# Patient Record
Sex: Female | Born: 1960 | Race: Black or African American | Hispanic: No | Marital: Single
Health system: Southern US, Community
[De-identification: ages and names within clinical notes are randomized; demographics above are authoritative.]

## PROBLEM LIST (undated history)

## (undated) DIAGNOSIS — R569 Unspecified convulsions: Secondary | ICD-10-CM

## (undated) DIAGNOSIS — I10 Essential (primary) hypertension: Secondary | ICD-10-CM

## (undated) NOTE — *Deleted (*Deleted)
February 09, 2020 ICU attending note  Patient seen and examined with PA [name]. Patient seen for cardiac arrest.  This is a 29 year old woman with hx of HTN and seizure who suffered an out of hospital witnessed arrest preceded by seizures.  Unknown downtime before CPR started.  Initial rhythm on monitor wide complex, shocked.  Supposedly 5 minutes downtime although documentation is spotty.  Patient did not regain consciousness so cooling protocol initiated.  There were some ST changes on her initial EKG, cardiology evaluated the patient and deferred catheterization as it was thought this was a stress reaction rather than a ST EMI.  With sedation halting here, the patient has rhythmic twitching, rightward gaze deviation, does have a cough and a gag, she will open her eyes to sternal rub but will not follow any commands.  Lungs are clear, heart sounds are regular, extremities are warm.  Abdomen is soft with hypoactive bowel sounds.  ABG showing respiratory insufficiency.  Unclear if ventilator was changed after this. Blood chemistries notable for hypokalemia, hyperglycemia, mild acute kidney injury, elevation of AST over ALT greater than 2-1.  Troponin level 54-1 80.  White count is 21 with a lymphocytosis.  Urine toxicology showing cocaine. Alcohol level not checked for unclear reason.  Assessment Out of hospital cardiac arrest, presumed shockable initial rhythm.  Documentation is spotty but at least 5 minutes of CPR, unclear downtime.  U tox showing cocaine.  Alcohol level pending.  Proceeding seizure.  Differential is toxic effects of cocaine, seizure itself.  Acute hypoxic and hypercarbic respiratory failure secondary to cardiac arrest.  On the ventilator.  Acute toxic metabolic encephalopathy with question of anoxic brain injury.  Acute kidney injury (cr 1.1, 0.78 on separate chart)  Plan Targeted temperature management, continuous EEG, echocardiogram Follow-up alcohol and Dilantin levels Avoid  nephrotoxic agents, LR at 75/hr Seizure precautions Propofol and fentanyl for sedation Prognostication pending 72 hours post rewarming unless there are more ominous signs like refractory status epilepticus or myoclonus. We will reach out to family   Patient critically ill due to cardiac arrest Interventions to address this today targeted temp management, ventilator management, EEG, sedation Risk of deterioration without these interventions is high  I personally spent 34 minutes providing critical care not including any separately billable procedures  Myrla Halsted MD [facility] 02/09/20 8:16 PM Personal pager: #[phone] If unanswered, please page CCM On-call: #[phone]

---

## 2004-12-29 ENCOUNTER — Emergency Department: Payer: Self-pay | Admitting: Unknown Physician Specialty

## 2005-03-12 ENCOUNTER — Emergency Department: Payer: Self-pay | Admitting: Emergency Medicine

## 2005-03-12 ENCOUNTER — Other Ambulatory Visit: Payer: Self-pay

## 2005-03-13 ENCOUNTER — Inpatient Hospital Stay: Payer: Self-pay | Admitting: Infectious Diseases

## 2006-10-22 ENCOUNTER — Ambulatory Visit: Payer: Self-pay

## 2006-10-24 ENCOUNTER — Ambulatory Visit: Payer: Self-pay

## 2007-11-23 ENCOUNTER — Emergency Department: Payer: Self-pay | Admitting: Emergency Medicine

## 2011-06-14 ENCOUNTER — Emergency Department: Payer: Self-pay | Admitting: *Deleted

## 2011-06-14 LAB — CBC WITH DIFFERENTIAL/PLATELET
Basophil #: 0 10*3/uL (ref 0.0–0.1)
Basophil %: 0.4 %
Eosinophil #: 0 10*3/uL (ref 0.0–0.7)
HCT: 33 % — ABNORMAL LOW (ref 35.0–47.0)
HGB: 10.6 g/dL — ABNORMAL LOW (ref 12.0–16.0)
Lymphocyte %: 37.8 %
MCH: 25 pg — ABNORMAL LOW (ref 26.0–34.0)
MCHC: 32.1 g/dL (ref 32.0–36.0)
Monocyte #: 0.5 10*3/uL (ref 0.0–0.7)
Monocyte %: 8.4 %
Neutrophil #: 3.4 10*3/uL (ref 1.4–6.5)
Neutrophil %: 53.1 %
RDW: 22.8 % — ABNORMAL HIGH (ref 11.5–14.5)

## 2011-06-14 LAB — COMPREHENSIVE METABOLIC PANEL
Alkaline Phosphatase: 59 U/L (ref 50–136)
BUN: 5 mg/dL — ABNORMAL LOW (ref 7–18)
Bilirubin,Total: 0.2 mg/dL (ref 0.2–1.0)
Chloride: 108 mmol/L — ABNORMAL HIGH (ref 98–107)
Co2: 24 mmol/L (ref 21–32)
Creatinine: 0.69 mg/dL (ref 0.60–1.30)
EGFR (African American): >60
EGFR (Non-African Amer.): >60
Osmolality: 280 (ref 275–301)
Sodium: 142 mmol/L (ref 136–145)
Total Protein: 8.8 g/dL — ABNORMAL HIGH (ref 6.4–8.2)

## 2011-06-14 LAB — PHENYTOIN LEVEL, TOTAL: Dilantin: 0.7 ug/mL — ABNORMAL LOW (ref 10.0–20.0)

## 2013-01-23 ENCOUNTER — Emergency Department: Payer: Self-pay | Admitting: Emergency Medicine

## 2014-03-13 LAB — DRUG SCREEN, URINE
Amphetamines, Ur Screen: NEGATIVE (ref ?–1000)
BENZODIAZEPINE, UR SCRN: NEGATIVE (ref ?–200)
Barbiturates, Ur Screen: NEGATIVE (ref ?–200)
CANNABINOID 50 NG, UR ~~LOC~~: NEGATIVE (ref ?–50)
COCAINE METABOLITE, UR ~~LOC~~: POSITIVE (ref ?–300)
MDMA (ECSTASY) UR SCREEN: NEGATIVE (ref ?–500)
METHADONE, UR SCREEN: NEGATIVE (ref ?–300)
Opiate, Ur Screen: NEGATIVE (ref ?–300)
PHENCYCLIDINE (PCP) UR S: NEGATIVE (ref ?–25)
TRICYCLIC, UR SCREEN: NEGATIVE (ref ?–1000)

## 2014-03-13 LAB — COMPREHENSIVE METABOLIC PANEL
ALK PHOS: 91 U/L
ALT: 46 U/L
Albumin: 3.5 g/dL (ref 3.4–5.0)
Anion Gap: 10 (ref 7–16)
BUN: 6 mg/dL — ABNORMAL LOW (ref 7–18)
Bilirubin,Total: 0.5 mg/dL (ref 0.2–1.0)
Calcium, Total: 8.2 mg/dL — ABNORMAL LOW (ref 8.5–10.1)
Chloride: 108 mmol/L — ABNORMAL HIGH (ref 98–107)
Co2: 22 mmol/L (ref 21–32)
Creatinine: 0.7 mg/dL (ref 0.60–1.30)
EGFR (African American): >60
Glucose: 81 mg/dL (ref 65–99)
Osmolality: 276 (ref 275–301)
Potassium: 3.8 mmol/L (ref 3.5–5.1)
SGOT(AST): 70 U/L — ABNORMAL HIGH (ref 15–37)
Sodium: 140 mmol/L (ref 136–145)
Total Protein: 8.7 g/dL — ABNORMAL HIGH (ref 6.4–8.2)

## 2014-03-13 LAB — URINALYSIS, COMPLETE
BILIRUBIN, UR: NEGATIVE
BLOOD: NEGATIVE
GLUCOSE, UR: NEGATIVE mg/dL (ref 0–75)
Ketone: NEGATIVE
Nitrite: NEGATIVE
Ph: 5 (ref 4.5–8.0)
Protein: NEGATIVE
SPECIFIC GRAVITY: 1.002 (ref 1.003–1.030)
Squamous Epithelial: <1
WBC UR: 3 /HPF (ref 0–5)

## 2014-03-13 LAB — CBC
HCT: 40.6 % (ref 35.0–47.0)
HGB: 13.8 g/dL (ref 12.0–16.0)
MCH: 32.8 pg (ref 26.0–34.0)
MCHC: 34 g/dL (ref 32.0–36.0)
MCV: 96 fL (ref 80–100)
Platelet: 355 10*3/uL (ref 150–440)
RBC: 4.22 10*6/uL (ref 3.80–5.20)
RDW: 13.7 % (ref 11.5–14.5)
WBC: 9.8 10*3/uL (ref 3.6–11.0)

## 2014-03-13 LAB — ACETAMINOPHEN LEVEL

## 2014-03-13 LAB — VALPROIC ACID LEVEL

## 2014-03-13 LAB — ETHANOL
Ethanol: 253 mg/dL
Ethanol: 26 mg/dL

## 2014-03-13 LAB — SALICYLATE LEVEL: SALICYLATES, SERUM: 2.8 mg/dL

## 2014-03-13 LAB — TSH: Thyroid Stimulating Horm: 0.57 u[IU]/mL

## 2014-03-13 LAB — PHENYTOIN LEVEL, TOTAL: Dilantin: 4 ug/mL — ABNORMAL LOW (ref 10.0–20.0)

## 2014-03-14 ENCOUNTER — Inpatient Hospital Stay: Payer: Self-pay | Admitting: Psychiatry

## 2014-07-23 NOTE — Consult Note (Signed)
PATIENT NAME:  Cristie HemGRAVES, Vertis F MR#:  [id number] DATE OF BIRTH:  September 03, 1960  DATE OF CONSULTATION:  03/14/2014  REFERRING PHYSICIAN:   CONSULTING PHYSICIAN:  Audery AmelJohn T. Clapacs, MD  IDENTIFYING INFORMATION AND REASON FOR CONSULTATION: This is a 54 year old woman with a history of substance abuse and depression who came into the hospital because of depressed mood and suicidal ideation. The patient's current chief complaint, "I don't even know what to think".   HISTORY OF PRESENT ILLNESS: Information obtained from the patient and the chart. The patient is being re-evaluated. Dr. Guss Bundehalla saw this patient previously and had taken her off involuntary commitment, but the patient was assessed by the [facility] liaison this morning who told me that he found the patient to still be suicidal. On reassessment today, the patient describes her mood as being down and depressed and that it has been that way for years. Her energy level was low. She has hopeless and negative thoughts. Cries frequently. She has had suicidal ideation about overdosing recently. Says that she still has wishes that she were dead. She does not report auditory or visual hallucinations. She has recently relapsed into alcohol and cocaine use. Has been drinking and using marijuana and cocaine steadily. The patient is not currently on any antidepressants or receiving any outpatient substance abuse treatment. Major stress is losing her job and also recently finding out her boyfriend had been cheating on her.   PAST PSYCHIATRIC HISTORY: Says she has had 1 prior admission [facility] under similar circumstances. She did not follow up with any outpatient treatment after that. Does not know if she has ever been on any medication for depression. Denies ever having been in any kind of substance abuse rehab program. Positive past suicide attempt.   FAMILY HISTORY: No known family history of mental illness or substance abuse.   SOCIAL HISTORY: Currently  living by himself. Boyfriend recently was found to be cheating on her and is not currently in her life. The patient was relieved from her job recently after having a seizure at work. She has had legal problems related to her alcohol abuse and has lost her driver's license.   PAST MEDICAL HISTORY: The patient has a history of a seizure disorder. The exact nature of it seems a little unclear as to whether it was only related to alcohol withdrawal or could be an independent seizure disorder. She says she has been prescribed Dilantin in the past. I think she is still prescribed Dilantin, but remarkably is still unclear about whether she is taking that medicine or not. Also has a history of high blood pressure.   CURRENT MEDICATIONS: Says she takes Dilantin and a high blood pressure medicine of some type, but does not know the details.   ALLERGIES: No known drug allergies.   SUBSTANCE ABUSE HISTORY: Long history of alcohol and cocaine and occasional marijuana abuse with only a few intermittent periods of sobriety. Problem has gotten worse recently. Possibly has a history of alcohol withdrawal seizures.   REVIEW OF SYSTEMS: Depressed mood. Tearfulness. Low energy. Positive suicidal ideation. No hallucinations. Feels hopeless. No specific physical complaints currently.   MENTAL STATUS EXAMINATION: Disheveled woman, looks older than her stated age. Poor eye contact. Psychomotor activity very slow. Speech very slow, quiet, and decreased in amount. Affect tearful and depressed. Mood stated as depressed. Thoughts are slow. Lots of thought blocking. No obvious delusions. Does not report paranoia. Does not report auditory or visual hallucinations. Could remember 3/3 objects immediately,  only 2/3 at three minutes. She is alert and oriented x4. Judgment and insight currently adequate. Positive suicidal and homicidal ideation without specific plan, but does think about overdosing.   LABORATORY RESULTS: Drug screen  positive only for cocaine. Chemistry panel initially elevated. Chloride 108, low calcium 8.2, elevated AST at 70, elevated total protein 8.7. Alcohol level presented around noon yesterday, 253. TSH normal. Dilantin level and valproic acid level undetectable. Alcohol level rechecked last evening was only 26.   VITAL SIGNS: Currently blood pressure 145/97, respirations 18, pulse 74, temperature 97.6.   ASSESSMENT: A 54 year old woman with a history of depression and alcohol and cocaine abuse. Currently having suicidal ideation, depressed mood, hopelessness, crying spells, cognitive slowing, not having any outpatient treatment. Because of suicidality and very poor functioning she meets criteria for admission for depression.   TREATMENT PLAN: Dr. Guss Bunde took this patient off commitment yesterday, but on re-evaluation today I think she would be best served by hospitalization. The patient agrees to voluntary hospitalization. I am restarting her Dilantin, giving her 300 mg now and then 100 mg 3 times a day until this can be sorted out whether she needs an anticonvulsant or not. Meanwhile also start her on metoprolol 50 mg extended release a day for her high blood pressure, which matches up with medicine she had been taking years ago. Suicide and seizure precautions in place.   DIAGNOSIS, PRINCIPAL AND PRIMARY:  AXIS I: Major depression, recurrent, severe.   SECONDARY DIAGNOSES: AXIS I:  1.  Alcohol abuse.  2.  Cocaine abuse. AXIS III: History of seizure disorder, details unclear, high blood pressure.  ____________________________ Audery Amel, MD jtc:sb D: 03/14/2014 11:24:42 ET T: 03/14/2014 11:35:48 ET JOB#: [id number]  cc: Audery Amel, MD, <Dictator> Audery Amel MD ELECTRONICALLY SIGNED 03/15/2014 14:56

## 2014-07-23 NOTE — Consult Note (Signed)
PATIENT NAME:  Angelica Stevens, Gaila F MR#:  [id number] DATE OF BIRTH:  1960/08/24  DATE OF CONSULTATION:  03/13/2014  CONSULTING PHYSICIAN:  Sanyah Molnar K. Jcion Buddenhagen, MD  SUBJECTIVE: The patient was seen in consultation in [facility] Emergency Room . The patient is a 54 year old, African-American female who is not employed. 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 The patient is living with a friend, whom she calls her boyfriend off and on. The patient was brought to the emergency room after her friend called an ambulance for help because she expressed suicidal wishes and she expressed depression.   CHIEF COMPLAINT: "I have been feeling depressed, feeling low and down."  HISTORY OF PRESENT ILLNESS: According to the patient, she had been drinking alcohol and was brought here for help. The patient reports that she got depressed and started drinking alcohol then she started smoking THC and crack cocaine after a while. The patient reports that she had 2 DWIs and she lost her driver's license and arrested for public drunkenness.   MENTAL STATUS: The patient is alert and oriented to place, person and time. Fully aware of the situation that brought her here. Affect is appropriate with her mood, which is low, down, depressed and stressed out with all the problems of life. No psychosis. Does not appear to be responding to internal stimuli. Cognition intact. General knowledge of information fair. Denies suicidal or homicidal plan and contracts for safety. She is eager to go and get help for her alcohol problems and for her substance abuse problems. Insight and judgment are fair. Impulse control is fair.   IMPRESSION:  1. Polysubstance abuse/dependence of alcohol, cannabinoids and crack cocaine, chronic continuous.  2. Substance use mood disorder.  RECOMMENDATIONS: Discontinue involuntary commitment and social services will try to contact [facility] for  placement for treatment for her substance abuse as wished by her. She wants to get help for the same.    ____________________________ Jannet MantisSurya K. Guss Bundehalla, MD skc:TT D: 03/13/2014 17:36:38 ET T: 03/13/2014 17:40:54 ET JOB#: [id number]  cc: Monika SalkSurya K. Guss Bundehalla, MD, <Dictator> Beau FannySURYA K Braxton Vantrease MD ELECTRONICALLY SIGNED 03/19/2014 15:13

## 2014-07-27 NOTE — H&P (Signed)
PATIENT NAME:  PATRICI, MINNIS MR#:  [id number] DATE OF BIRTH:  07-28-1960  DATE OF ADMISSION:  03/14/2014  DATE OF ASSESSMENT: 03/15/2014  REFERRING PHYSICIAN: Emergency Room MD   ATTENDING PHYSICIAN: Kristine Linea, MD   IDENTIFYING DATA: Ms. Wilk is a 54 year old female with history of untreated depression and substance abuse.   CHIEF COMPLAINT: "I don't know what to think."   HISTORY OF PRESENT ILLNESS: Ms. Bouley reports that she has been depressed for the past 10 years, but she has never sought any help. She also has been drinking and using cocaine heavily. Two days prior to admission, she found her live-in boyfriend in bed with another woman. She depends on this boyfriend financially and for housing, became very depressed and suicidal. She reportedly overdosed on Dilantin. She came to the Emergency Room and was evaluated by psychiatric consultant, Dr. Guss Bunde. At that time, she was no longer suicidal and was discharged to home. The next day she went to [facility] where they found that the patient is still unsafe and referred her back to the hospital. She was admitted to [facility]. She reports many symptoms of depression with poor sleep, decreased appetite, anhedonia, feeling of guilt, hopelessness, worthlessness, poor energy and concentration, crying spells, social isolation, and now suicidal ideation. She says that she did overdose on Dilantin, however, her Dilantin level on admission was not detectable. She should be taking Dilantin for seizure disorder, but it is quite possible that she has been noncompliant and even with overdose did not achieve measurable level. She still feels depressed, hopeless, suicidal, uncertain what to do. She has been with her boyfriend for 6 years. They live together. This is "their place" she says, but is uncertain what to do. She has no other support. She denies psychotic symptoms, denies symptoms suggestive of bipolar mania. She  has been drinking and using cocaine "enough to cause trouble". She is not specific.   PAST PSYCHIATRIC HISTORY: One prior hospitalization 10 years ago at [facility], also after an overdose in similar circumstances, but with a different man. She has not been taking any psychotropic medications. She has a lifelong history of substance use, but has never been treated for that either.   FAMILY PSYCHIATRIC HISTORY: No history of substance use or depression in the family. No completed suicides.   PAST MEDICAL HISTORY: Seizure disorder, hypertension.   ALLERGIES: No known drug allergies.   MEDICATIONS ON ADMISSION: Dilantin 300 mg at bedtime, unknown antihypertensive.   SOCIAL HISTORY: She is divorced. She has an adult daughter who lives in the area, but she is not in touch with her. She does not stay in touch with any of her family. 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 She has had several DUIs and was arrested for public drunkenness. She lives with a boyfriend of 6 years who has a job and has been supporting her all along. She applied for disability several years ago, but was denied.   REVIEW OF SYSTEMS: CONSTITUTIONAL: No fevers or chills. No weight changes.  EYES: No double or blurred vision.  ENT: No hearing loss.  RESPIRATORY: No shortness of breath or cough.  CARDIOVASCULAR: No chest pain or orthopnea.  GASTROINTESTINAL: No abdominal pain, nausea, vomiting, or diarrhea.  GENITOURINARY: No incontinence or frequency.  ENDOCRINE: No heat or cold intolerance.  LYMPHATIC: No anemia or easy bruising.  INTEGUMENTARY: No acne or rash.  MUSCULOSKELETAL: No muscle or joint pain.  NEUROLOGIC: No tingling or weakness.  PSYCHIATRIC: See history of present illness for details.   PHYSICAL EXAMINATION: VITAL SIGNS: Blood pressure 149/89, pulse 54, respirations 18, temperature 98.5.  GENERAL: This is a slender female looking older than  stated age, in no acute distress.  HEENT: The pupils are equal, round, and reactive to light. Sclerae are anicteric.  NECK: Supple. No thyromegaly.  LUNGS: Clear to auscultation. No dullness to percussion.  HEART: Regular rhythm and rate. No murmurs, rubs, or gallops.  ABDOMEN: Soft, nontender, nondistended. Positive bowel sounds.  MUSCULOSKELETAL: Normal muscle strength in all extremities.  SKIN: No rashes or bruises.  LYMPHATIC: No cervical adenopathy.  NEUROLOGIC: Cranial nerves II through XII are intact.   DIAGNOSTIC DATA: Laboratory data: Chemistries are within normal limits. Blood alcohol level on admission 253. LFTs within normal limits, except for AST of 70. TSH 0.57. Serum Dilantin less than 4. Depakote level less than 3. Urine tox screen positive for cocaine. CBC within normal limits. Urinalysis is positive for leukocyte esterase 2+ and 3 white cells per field. Serum acetaminophen and salicylates are low.   EKG: Normal sinus rhythm, biatrial enlargement, left ventricular hypertrophy, septal infarct; cited in 2006, abnormal EKG.  MENTAL STATUS EXAMINATION ON ADMISSION: The patient is alert and oriented to person, place, time and situation. She is pleasant, polite and cooperative. She is well groomed, wearing hospital scrubs. She maintains some eye contact. Her speech is slow and soft. Mood is depressed with flat affect. Thought process is logical and goal oriented. She is still endorsing suicidal ideation, but is able to contract for safety in the hospital. There are no thoughts of hurting others. She denies delusions or paranoia, auditory or visual hallucinations. Her cognition is grossly intact. Registration, recall, short and long-term memory seem intact. She is of average intelligence and fund of knowledge. Her insight and judgment are poor.   SUICIDE RISK ASSESSMENT ON ADMISSION: This is a patient with history of untreated depression and substance abuse who came to the hospital after a  suicide attempt.   INITIAL DIAGNOSES:  AXIS I:  1.  Major depressive disorder, recurrent, severe without psychotic features. 2.  Alcohol use disorder, severe.  3.  Cocaine use disorder, severe.  AXIS II: Deferred.  AXIS III: Seizure disorder, hypertension.   PLAN: The patient was admitted to [facility] unit for safety, stabilization and medication management.  1.  Suicidal ideation: The patient is able to contract for safety.  2.  Mood: We will start Prozac 20 mg daily for depression.  3.  Alcohol detox: She is on the [facility] protocol.  4.  Seizure disorder: She was restarted on Dilantin.  5.  Hypertension: Blood pressure is slightly elevated. We will monitored and offer antihypertensive if necessary.  6.  Substance abuse treatment: The patient is very much interested in alcohol rehab participation. We will try to refer to [facility].  7.  Disposition: To be established. ____________________________ Ellin GoodieJolanta B. Jennet MaduroPucilowska, MD jbp:sb D: 03/15/2014 11:51:51 ET T: 03/15/2014 12:39:41 ET JOB#: [id number]  cc: Novalynn Branaman B. Jennet MaduroPucilowska, MD, <Dictator> Shari ProwsJOLANTA B Isamar Wellbrock MD ELECTRONICALLY SIGNED 04/04/2014 19:30

## 2017-08-11 ENCOUNTER — Encounter: Payer: Self-pay | Admitting: Emergency Medicine

## 2017-08-11 ENCOUNTER — Emergency Department
Admission: EM | Admit: 2017-08-11 | Discharge: 2017-08-11 | Disposition: A | Discharge status: Home / Self Care | Payer: Self-pay | Attending: Emergency Medicine | Admitting: Emergency Medicine

## 2017-08-11 DIAGNOSIS — Y999 Unspecified external cause status: Secondary | ICD-10-CM | POA: Insufficient documentation

## 2017-08-11 DIAGNOSIS — F17200 Nicotine dependence, unspecified, uncomplicated: Secondary | ICD-10-CM | POA: Insufficient documentation

## 2017-08-11 DIAGNOSIS — W57XXXA Bitten or stung by nonvenomous insect and other nonvenomous arthropods, initial encounter: Secondary | ICD-10-CM | POA: Insufficient documentation

## 2017-08-11 DIAGNOSIS — Y939 Activity, unspecified: Secondary | ICD-10-CM | POA: Insufficient documentation

## 2017-08-11 DIAGNOSIS — S40862A Insect bite (nonvenomous) of left upper arm, initial encounter: Secondary | ICD-10-CM | POA: Insufficient documentation

## 2017-08-11 DIAGNOSIS — Y929 Unspecified place or not applicable: Secondary | ICD-10-CM | POA: Insufficient documentation

## 2017-08-11 DIAGNOSIS — I10 Essential (primary) hypertension: Secondary | ICD-10-CM | POA: Insufficient documentation

## 2017-08-11 HISTORY — DX: Unspecified convulsions: R56.9

## 2017-08-11 HISTORY — DX: Essential (primary) hypertension: I10

## 2017-08-11 MED ORDER — NAPROXEN 500 MG PO TABS
500.0000 mg | ORAL_TABLET | Freq: Once | ORAL | Status: AC
  Administered 2017-08-11: 500 mg via ORAL
  Filled 2017-08-11: qty 1

## 2017-08-11 MED ORDER — HYDROCHLOROTHIAZIDE 25 MG PO TABS: 25.0000 mg | ORAL_TABLET | Freq: Every day | ORAL | 0 refills | Status: DC

## 2017-08-11 MED ORDER — SULFAMETHOXAZOLE-TRIMETHOPRIM 800-160 MG PO TABS: 1.0000 | ORAL_TABLET | Freq: Two times a day (BID) | ORAL | 0 refills | Status: DC

## 2017-08-11 MED ORDER — HYDROXYZINE HCL 50 MG PO TABS
50.0000 mg | ORAL_TABLET | Freq: Once | ORAL | Status: AC
  Administered 2017-08-11: 50 mg via ORAL
  Filled 2017-08-11: qty 1

## 2017-08-11 MED ORDER — CLONIDINE HCL 0.1 MG PO TABS
0.2000 mg | ORAL_TABLET | Freq: Once | ORAL | Status: AC
  Administered 2017-08-11: 0.2 mg via ORAL
  Filled 2017-08-11: qty 2

## 2017-08-11 MED ORDER — HYDROXYZINE HCL 50 MG PO TABS: 50.0000 mg | ORAL_TABLET | Freq: Three times a day (TID) | ORAL | 0 refills | Status: DC | PRN

## 2017-08-11 NOTE — ED Triage Notes (Signed)
Pt reports got bit by something on her left arm last night and it is painful.

## 2017-08-11 NOTE — ED Provider Notes (Signed)
[facility] Emergency Department Provider Note   ____________________________________________   First MD Initiated Contact with Patient 08/11/17 1039     (approximate)  I have reviewed the triage vital signs and the nursing notes.   HISTORY  Chief Complaint Insect Bite    HPI Angelica Stevens is a 57 y.o. female patient is here today for chief complaint of insect bite to the left arm which occurred last night.  Patient stated area is painful.  Patient is also requesting a refill of blood pressure and Dilantin.  Patient states he has been out of these medicines for several months.  Patient is a poor historian.  I asked the patient which she received the prescription from she said from this ED.  However the patient has not been in this ED since 2015.  Patient does state it might be from [facility].  She said a voucher was given and she was at the prescriptions filled.  When questioned the patient did not know the names of medications or the doses.  Patient unsure which pharmacy filled the prescriptions.  Past Medical History:  Diagnosis Date  . Hypertension   . Seizures (HCC)     There are no active problems to display for this patient.   History reviewed. No pertinent surgical history.  Prior to Admission medications   Medication Sig Start Date End Date Taking? Authorizing Provider  hydrochlorothiazide (HYDRODIURIL) 25 MG tablet Take 1 tablet (25 mg total) by mouth daily. 08/11/17   Joni Reining, PA-C  hydrOXYzine (ATARAX/VISTARIL) 50 MG tablet Take 1 tablet (50 mg total) by mouth 3 (three) times daily as needed for itching. 08/11/17   Joni Reining, PA-C  sulfamethoxazole-trimethoprim (BACTRIM DS,SEPTRA DS) 800-160 MG tablet Take 1 tablet by mouth 2 (two) times daily. 08/11/17   Joni Reining, PA-C    Allergies Patient has no known allergies.  No family history on file.  Social History Social History   Tobacco Use  . Smoking status:  Current Every Day Smoker  . Smokeless tobacco: Never Used  Substance Use Topics  . Alcohol use: Not on file  . Drug use: Not on file    Review of Systems Constitutional: No fever/chills Eyes: No visual changes. ENT: No sore throat. Cardiovascular: Denies chest pain. Respiratory: Denies shortness of breath. Gastrointestinal: No abdominal pain.  No nausea, no vomiting.  No diarrhea.  No constipation. Genitourinary: Negative for dysuria. Musculoskeletal: Negative for back pain. Skin: Negative for rash.  Insect bite right shoulder Neurological: Positive for headaches, but denies focal weakness or numbness.  Subjective history of seizure Endocrine:Hypertension   ____________________________________________   PHYSICAL EXAM:  VITAL SIGNS: ED Triage Vitals [08/11/17 0935]  Enc Vitals Group     BP (!) 200/70     Pulse Rate 61     Resp 20     Temp 98.4 F (36.9 C)     Temp Source Oral     SpO2 97 %     Weight 142 lb (64.4 kg)     Height  (1.575 m)     Head Circumference      Peak Flow      Pain Score 10     Pain Loc      Pain Edu?      Excl. in GC?    Constitutional: Alert and oriented. Well appearing and in no acute distress. Eyes: Conjunctivae are normal. PERRL. EOMI. Head: Atraumatic. Nose: No congestion/rhinnorhea. Mouth/Throat: Mucous membranes are  moist.  Oropharynx non-erythematous. Neck: No stridor. Hematological/Lymphatic/Immunilogical: No cervical lymphadenopathy. Cardiovascular: Normal rate, regular rhythm. Grossly normal heart sounds.  Good peripheral circulation.  Elevated systolic blood pressure. Respiratory: Normal respiratory effort.  No retractions. Lungs CTAB. Gastrointestinal: Soft and nontender. No distention. No abdominal bruits. No CVA tenderness. Musculoskeletal: No lower extremity tenderness nor edema.  No joint effusions. Neurologic:  Normal speech and language. No gross focal neurologic deficits are appreciated. No gait instability. Skin:  Papular lesion on erythematous edematous base.   Psychiatric: Mood and affect are normal. Speech and behavior are normal.  ____________________________________________   LABS (all labs ordered are listed, but only abnormal results are displayed)  Labs Reviewed - No data to display ____________________________________________  EKG   ____________________________________________  RADIOLOGY  ED MD interpretation:    Official radiology report(s): No results found.  ____________________________________________   PROCEDURES  Procedure(s) performed:   Procedures  Critical Care performed: No  ____________________________________________   INITIAL IMPRESSION / ASSESSMENT AND PLAN / ED COURSE  As part of my medical decision making, I reviewed the following data within the electronic MEDICAL RECORD NUMBER    Patient presents to the ED for complaint of insect bite to the left shoulder.  Patient also requesting refill of unknown hypertension and seizure medications.  Patient initial BP was 217/83.  1 hour status post Catapres BP dropped to 156/63.  Patient advised to reestablish care with [facility] for correct hypertension seizure medication and doses.  Patient discharge with prescription for Bactrim DS, naproxen, and 15-day supply of HCTZ.      ____________________________________________   FINAL CLINICAL IMPRESSION(S) / ED DIAGNOSES  Final diagnoses:  Bug bite with infection, initial encounter  Hypertension, unspecified type     ED Discharge Orders        Ordered    hydrochlorothiazide (HYDRODIURIL) 25 MG tablet  Daily     08/11/17 1225    sulfamethoxazole-trimethoprim (BACTRIM DS,SEPTRA DS) 800-160 MG tablet  2 times daily     08/11/17 1225    hydrOXYzine (ATARAX/VISTARIL) 50 MG tablet  3 times daily PRN     08/11/17 1225       Note:  This document was prepared using Dragon voice recognition software and may include unintentional dictation errors.      Joni Reining, PA-C 08/11/17 1230    Don Perking, [place], MD 08/11/17 [phone]

## 2017-08-11 NOTE — ED Triage Notes (Signed)
Pt reports is supposed to be on BP meds but she has been out. Pt is supposed to be on dilantin but needs a refil on both.

## 2017-08-11 NOTE — ED Notes (Signed)
See triage note  Stats she thinks she may have been bitten by something on left shoulder area  Having some pain and min swelling   Also states she is out of her b/p meds and dilantin  Unsure of last dose

## 2017-08-11 NOTE — Discharge Instructions (Signed)
Advised to reestablish care with [facility].

## 2018-05-03 ENCOUNTER — Emergency Department: Payer: Self-pay

## 2018-05-03 ENCOUNTER — Emergency Department
Admission: EM | Admit: 2018-05-03 | Discharge: 2018-05-03 | Disposition: A | Discharge status: Home / Self Care | Payer: Self-pay | Attending: Emergency Medicine | Admitting: Emergency Medicine

## 2018-05-03 ENCOUNTER — Other Ambulatory Visit: Payer: Self-pay

## 2018-05-03 ENCOUNTER — Encounter: Payer: Self-pay | Admitting: Emergency Medicine

## 2018-05-03 DIAGNOSIS — J189 Pneumonia, unspecified organism: Secondary | ICD-10-CM

## 2018-05-03 DIAGNOSIS — F17200 Nicotine dependence, unspecified, uncomplicated: Secondary | ICD-10-CM | POA: Insufficient documentation

## 2018-05-03 DIAGNOSIS — Z79899 Other long term (current) drug therapy: Secondary | ICD-10-CM | POA: Insufficient documentation

## 2018-05-03 DIAGNOSIS — J101 Influenza due to other identified influenza virus with other respiratory manifestations: Secondary | ICD-10-CM

## 2018-05-03 DIAGNOSIS — J09X1 Influenza due to identified novel influenza A virus with pneumonia: Secondary | ICD-10-CM | POA: Insufficient documentation

## 2018-05-03 DIAGNOSIS — I10 Essential (primary) hypertension: Secondary | ICD-10-CM | POA: Insufficient documentation

## 2018-05-03 LAB — INFLUENZA PANEL BY PCR (TYPE A & B)
INFLAPCR: POSITIVE — AB
Influenza B By PCR: NEGATIVE

## 2018-05-03 LAB — GROUP A STREP BY PCR: Group A Strep by PCR: NOT DETECTED

## 2018-05-03 MED ORDER — AZITHROMYCIN 250 MG PO TABS: ORAL_TABLET | ORAL | 0 refills | Status: AC

## 2018-05-03 MED ORDER — ACETAMINOPHEN 500 MG PO TABS
ORAL_TABLET | ORAL | Status: AC
  Filled 2018-05-03: qty 2

## 2018-05-03 MED ORDER — CEFTRIAXONE SODIUM 1 G IJ SOLR
1.0000 g | Freq: Once | INTRAMUSCULAR | Status: AC
  Administered 2018-05-03: 1 g via INTRAMUSCULAR
  Filled 2018-05-03: qty 10

## 2018-05-03 MED ORDER — ACETAMINOPHEN 500 MG PO TABS
1000.0000 mg | ORAL_TABLET | Freq: Once | ORAL | Status: AC
  Administered 2018-05-03: 1000 mg via ORAL

## 2018-05-03 NOTE — ED Triage Notes (Signed)
C/O fever and chills x 3 days.  States today was also dizzy with near syncope.  Patient is AAOx3.  Skin warm and dry.  Has not medicated for fever.  Only taking Theraflu, last take about 1 jour PTA

## 2018-05-03 NOTE — ED Provider Notes (Signed)
Emergency Department Provider Note  ____________________________________________  Time seen: Approximately 7:50 PM  I have reviewed the triage vital signs and the nursing notes.   HISTORY  Chief Complaint Fever and Chills    HPI Angelica Stevens is a 58 y.o. female presents to the emergency department with nonproductive cough and fatigue for 5 to 6 days and rhinorrhea, continued nasal congestion and body aches for the past 3 to 4 days.  Patient denies shortness of breath, chest tightness, chest pain or abdominal pain.  She has had both vomiting and diarrhea.  No recent travel.  No alleviating measures been attempted.   Past Medical History:  Diagnosis Date  . Hypertension   . Seizures (HCC)     There are no active problems to display for this patient.   History reviewed. No pertinent surgical history.  Prior to Admission medications   Medication Sig Start Date End Date Taking? Authorizing Provider  azithromycin (ZITHROMAX Z-PAK) 250 MG tablet Take 2 tablets (500 mg) on  Day 1,  followed by 1 tablet (250 mg) once daily on Days 2 through 5. 05/03/18 05/08/18  Orvil Feil, PA-C  hydrochlorothiazide (HYDRODIURIL) 25 MG tablet Take 1 tablet (25 mg total) by mouth daily. 08/11/17   Joni Reining, PA-C  hydrOXYzine (ATARAX/VISTARIL) 50 MG tablet Take 1 tablet (50 mg total) by mouth 3 (three) times daily as needed for itching. 08/11/17   Joni Reining, PA-C  sulfamethoxazole-trimethoprim (BACTRIM DS,SEPTRA DS) 800-160 MG tablet Take 1 tablet by mouth 2 (two) times daily. 08/11/17   Joni Reining, PA-C    Allergies Patient has no known allergies.  No family history on file.  Social History Social History   Tobacco Use  . Smoking status: Current Every Day Smoker  . Smokeless tobacco: Never Used  Substance Use Topics  . Alcohol use: Not on file  . Drug use: Not on file      Review of Systems  Constitutional: Patient has fever.  Eyes: No visual changes. No  discharge ENT: Patient has congestion.  Cardiovascular: no chest pain. Respiratory: Patient has cough.  Gastrointestinal: No abdominal pain.  No nausea, no vomiting. Patient had diarrhea.  Genitourinary: Negative for dysuria. No hematuria Musculoskeletal: Patient has myalgias.  Skin: Negative for rash, abrasions, lacerations, ecchymosis. Neurological: Patient has headache, no focal weakness or numbness.     ____________________________________________   PHYSICAL EXAM:  VITAL SIGNS: ED Triage Vitals  Enc Vitals Group     BP 05/03/18 1731 (!) 175/70     Pulse Rate 05/03/18 1731 95     Resp 05/03/18 1731 16     Temp 05/03/18 1731 (!) 103.2 F (39.6 C)     Temp Source 05/03/18 1731 Oral     SpO2 05/03/18 1731 95 %     Weight 05/03/18 1730 130 lb (59 kg)     Height 05/03/18 1730 5\' 2"  (1.575 m)     Head Circumference --      Peak Flow --      Pain Score 05/03/18 1730 10     Pain Loc --      Pain Edu? --      Excl. in GC? --      Constitutional: Alert and oriented. Patient is lying supine. Eyes: Conjunctivae are normal. PERRL. EOMI. Head: Atraumatic. ENT:      Ears: Tympanic membranes are mildly injected with mild effusion bilaterally.       Nose: No congestion/rhinnorhea.  Mouth/Throat: Mucous membranes are moist. Posterior pharynx is mildly erythematous.  Hematological/Lymphatic/Immunilogical: No cervical lymphadenopathy.  Cardiovascular: Normal rate, regular rhythm. Normal S1 and S2.  Good peripheral circulation. Respiratory: Normal respiratory effort without tachypnea or retractions. Lungs CTAB. Good air entry to the bases with no decreased or absent breath sounds. Gastrointestinal: Bowel sounds 4 quadrants. Soft and nontender to palpation. No guarding or rigidity. No palpable masses. No distention. No CVA tenderness. Musculoskeletal: Full range of motion to all extremities. No gross deformities appreciated. Neurologic:  Normal speech and language. No gross focal  neurologic deficits are appreciated.  Skin:  Skin is warm, dry and intact. No rash noted. Psychiatric: Mood and affect are normal. Speech and behavior are normal. Patient exhibits appropriate insight and judgement.   ____________________________________________   LABS (all labs ordered are listed, but only abnormal results are displayed)  Labs Reviewed  INFLUENZA PANEL BY PCR (TYPE A & B) - Abnormal; Notable for the following components:      Result Value   Influenza A By PCR POSITIVE (*)    All other components within normal limits  GROUP A STREP BY PCR   ____________________________________________  EKG   ____________________________________________  RADIOLOGY I personally viewed and evaluated these images as part of my medical decision making, as well as reviewing the written report by the radiologist.  Dg Chest 2 View  Result Date: 05/03/2018 CLINICAL DATA:  58 year old female with fever and chills for 3 days. Dizziness and near-syncope. EXAM: CHEST - 2 VIEW COMPARISON:  Portable chest 03/13/2005. FINDINGS: Cardiac size is stable at the upper limits of normal. Other mediastinal contours are within normal limits. Visualized tracheal air column is within normal limits. No pneumothorax, pulmonary edema or pleural effusion. Streaky bilateral lower lung opacity which appears to be in the middle lobes on the lateral view. No osseous abnormality identified. Negative visible bowel gas pattern. IMPRESSION: Streaky bilateral middle lobe opacity is nonspecific but suspicious for bilateral bronchopneumonia in this clinical setting. No pleural effusion. Electronically Signed   By: Odessa FlemingH  Hall M.D.   On: 05/03/2018 18:58    ____________________________________________    PROCEDURES  Procedure(s) performed:    Procedures    Medications  acetaminophen (TYLENOL) 500 MG tablet (  Not Given 05/03/18 1738)  acetaminophen (TYLENOL) tablet 1,000 mg (1,000 mg Oral Given 05/03/18 1734)   cefTRIAXone (ROCEPHIN) injection 1 g (1 g Intramuscular Given 05/03/18 1932)     ____________________________________________   INITIAL IMPRESSION / ASSESSMENT AND PLAN / ED COURSE  Pertinent labs & imaging results that were available during my care of the patient were reviewed by me and considered in my medical decision making (see chart for details).  Review of the  [place] was performed in accordance of the [place] prior to dispensing any controlled drugs.      Assessment and Plan:  Community-acquired pneumonia Influenza A Patient presents to the emergency department with flulike symptoms.  She tested positive for influenza A in the emergency department.  Patient also had streaky opacities visualized bilaterally concerning for community-acquired pneumonia.  Patient was given Rocephin in the emergency department.  She was discharged with azithromycin.  Rest hydration were encouraged at home.  Tylenol and ibuprofen alternating for fever were recommended.   ____________________________________________  FINAL CLINICAL IMPRESSION(S) / ED DIAGNOSES  Final diagnoses:  Community acquired pneumonia, unspecified laterality  Influenza A      NEW MEDICATIONS STARTED DURING THIS VISIT:  ED Discharge Orders         Ordered  azithromycin (ZITHROMAX Z-PAK) 250 MG tablet     05/03/18 1934              This chart was dictated using voice recognition software/Dragon. Despite best efforts to proofread, errors can occur which can change the meaning. Any change was purely unintentional.    Orvil Feil, PA-C 05/03/18 1953    Jeanmarie Plant, MD 05/04/18 [phone]

## 2018-07-31 ENCOUNTER — Emergency Department
Admission: EM | Admit: 2018-07-31 | Discharge: 2018-07-31 | Disposition: A | Discharge status: Home / Self Care | Payer: Self-pay | Attending: Emergency Medicine | Admitting: Emergency Medicine

## 2018-07-31 ENCOUNTER — Other Ambulatory Visit: Payer: Self-pay

## 2018-07-31 DIAGNOSIS — Z79899 Other long term (current) drug therapy: Secondary | ICD-10-CM | POA: Insufficient documentation

## 2018-07-31 DIAGNOSIS — I4581 Long QT syndrome: Secondary | ICD-10-CM | POA: Insufficient documentation

## 2018-07-31 DIAGNOSIS — R5383 Other fatigue: Secondary | ICD-10-CM | POA: Insufficient documentation

## 2018-07-31 DIAGNOSIS — R9431 Abnormal electrocardiogram [ECG] [EKG]: Secondary | ICD-10-CM

## 2018-07-31 DIAGNOSIS — E876 Hypokalemia: Secondary | ICD-10-CM | POA: Insufficient documentation

## 2018-07-31 DIAGNOSIS — I10 Essential (primary) hypertension: Secondary | ICD-10-CM | POA: Insufficient documentation

## 2018-07-31 DIAGNOSIS — F17200 Nicotine dependence, unspecified, uncomplicated: Secondary | ICD-10-CM | POA: Insufficient documentation

## 2018-07-31 LAB — CBC
HCT: 40.5 % (ref 36.0–46.0)
Hemoglobin: 14.3 g/dL (ref 12.0–15.0)
MCH: 33.7 pg (ref 26.0–34.0)
MCHC: 35.3 g/dL (ref 30.0–36.0)
MCV: 95.5 fL (ref 80.0–100.0)
Platelets: 325 10*3/uL (ref 150–400)
RBC: 4.24 MIL/uL (ref 3.87–5.11)
RDW: 13.2 % (ref 11.5–15.5)
WBC: 6.3 10*3/uL (ref 4.0–10.5)
nRBC: 0 % (ref 0.0–0.2)

## 2018-07-31 LAB — BASIC METABOLIC PANEL
Anion gap: 9 (ref 5–15)
BUN: 12 mg/dL (ref 6–20)
CO2: 29 mmol/L (ref 22–32)
Calcium: 9.6 mg/dL (ref 8.9–10.3)
Chloride: 104 mmol/L (ref 98–111)
Creatinine, Ser: 0.8 mg/dL (ref 0.44–1.00)
GFR calc Af Amer: >60 mL/min (ref >60)
GFR calc non Af Amer: >60 mL/min (ref >60)
Glucose, Bld: 99 mg/dL (ref 70–99)
Potassium: 3.2 mmol/L — ABNORMAL LOW (ref 3.5–5.1)
Sodium: 142 mmol/L (ref 135–145)

## 2018-07-31 LAB — MAGNESIUM: Magnesium: 2 mg/dL (ref 1.7–2.4)

## 2018-07-31 LAB — GLUCOSE, CAPILLARY: Glucose-Capillary: 95 mg/dL (ref 70–99)

## 2018-07-31 MED ORDER — HYDROCHLOROTHIAZIDE 12.5 MG PO CAPS
12.5000 mg | ORAL_CAPSULE | Freq: Every day | ORAL | Status: DC
  Administered 2018-07-31: 12.5 mg via ORAL
  Filled 2018-07-31: qty 1

## 2018-07-31 MED ORDER — LISINOPRIL 10 MG PO TABS
20.0000 mg | ORAL_TABLET | Freq: Once | ORAL | Status: AC
  Administered 2018-07-31: 20 mg via ORAL
  Filled 2018-07-31: qty 2

## 2018-07-31 MED ORDER — POTASSIUM CHLORIDE CRYS ER 20 MEQ PO TBCR
40.0000 meq | EXTENDED_RELEASE_TABLET | Freq: Once | ORAL | Status: AC
  Administered 2018-07-31: 40 meq via ORAL
  Filled 2018-07-31: qty 2

## 2018-07-31 MED ORDER — PHENYTOIN SODIUM EXTENDED 100 MG PO CAPS: 300.0000 mg | ORAL_CAPSULE | Freq: Every day | ORAL | 0 refills | Status: DC

## 2018-07-31 MED ORDER — LISINOPRIL-HYDROCHLOROTHIAZIDE 20-12.5 MG PO TABS: 1.0000 | ORAL_TABLET | Freq: Every day | ORAL | 0 refills | Status: DC

## 2018-07-31 MED ORDER — PHENYTOIN SODIUM EXTENDED 100 MG PO CAPS
300.0000 mg | ORAL_CAPSULE | Freq: Once | ORAL | Status: DC
  Filled 2018-07-31: qty 3

## 2018-07-31 NOTE — ED Triage Notes (Signed)
Weakness and fatigue X 3 days, seeing "spider webs" in eyes. Pt alert and oriented X4, active, cooperative, pt in NAD. RR even and unlabored, color WNL.  Denies CP, SOB.

## 2018-07-31 NOTE — ED Notes (Signed)
Po med given as ordered awaiting further plan of care.

## 2018-07-31 NOTE — ED Notes (Signed)
Pt to desk to inquire about wait time. Pt informed that she is the current longest wait. Pt states that she may leave to go get a burger and then come back. Pt informed if she leaves and comes back then her wait time will start over. Pt is in no distress at this time. Ambulating around waiting room without difficulty.

## 2018-07-31 NOTE — ED Notes (Signed)
First Nurse Note: Pt to ED via POV c/o high blood pressure. Pt states that she has been out of medication

## 2018-07-31 NOTE — ED Notes (Signed)
Assumed care of patient reports feeling weak, and has ran out of her prescription, here to obtain a prescription. Has not taken her meds in 3 days.

## 2018-07-31 NOTE — ED Notes (Signed)
Pt requesting to go to parking lot to check on her ride. MD had made this RN aware. RN attempting to figure out the safest way for pt to go to parking lot but pt left treatment room before RN could formulate a plan. MD spoke with patient in the hall and updated that he could write discharge papers as soon as he spoke to the patient he was in a room with but pt refused to wait and ambulated out or ED in NAD and without difficulty.

## 2018-08-01 NOTE — ED Provider Notes (Signed)
[facility] Emergency Department Provider Note   ____________________________________________   First MD Initiated Contact with Patient 07/31/18 1933     (approximate)  I have reviewed the triage vital signs and the nursing notes.   HISTORY  Chief Complaint Weakness    HPI Angelica Stevens is a [age] y.o. female here for evaluation for fatigue  Patient reports that she has been feeling fatigued for the last day.  She attributes this to running out of her medication, she normally takes seizure medication and hydrochlorothiazide and lisinopril all 3 of which she ran out of 3 to 4 days ago.  No chest pain no trouble breathing.  No nausea vomiting.  Reports just feeling fatigued.  She reports this happened before when she was on of her medications.  [facility], but they were not open and thus she tried to get it refilled at a local pharmacy who reports they cannot fill it  No recent seizures.  No episodes of passing out.  No family history of coronary disease.  Reports she would like to have her medications refilled and be able to leave   Patient reports was her birthday 2 days ago and the last 2 days she has been drinking pretty heavily.  She woke up this morning feeling very lightheaded and thinks she feels dehydrated, not drinking today is not a regular drinker  Past Medical History:  Diagnosis Date  . Hypertension   . Seizures (HCC)     There are no active problems to display for this patient.   History reviewed. No pertinent surgical history.  Prior to Admission medications   Medication Sig Start Date End Date Taking? Authorizing Provider  hydrochlorothiazide (HYDRODIURIL) 25 MG tablet Take 1 tablet (25 mg total) by mouth daily. 08/11/17  Yes Joni ReiningSmith, Ronald K, PA-C  hydrOXYzine (ATARAX/VISTARIL) 50 MG tablet Take 1 tablet (50 mg total) by mouth 3 (three) times daily as needed for itching. 08/11/17  Yes Joni ReiningSmith, Ronald K, PA-C   lisinopril-hydrochlorothiazide (ZESTORETIC) 20-12.5 MG tablet Take 1 tablet by mouth daily. 07/31/18 07/31/19  Sharyn CreamerQuale, , MD  phenytoin (DILANTIN) 100 MG ER capsule Take 3 capsules (300 mg total) by mouth at bedtime. 07/31/18 07/31/19  Sharyn CreamerQuale, , MD  sulfamethoxazole-trimethoprim (BACTRIM DS,SEPTRA DS) 800-160 MG tablet Take 1 tablet by mouth 2 (two) times daily. Patient not taking: Reported on 07/31/2018 08/11/17   Joni ReiningSmith, Ronald K, PA-C    Allergies Patient has no known allergies.  No family history on file.  Social History Social History   Tobacco Use  . Smoking status: Current Every Day Smoker  . Smokeless tobacco: Never Used  Substance Use Topics  . Alcohol use: Yes  . Drug use: Not on file    Review of Systems Constitutional: No fever/chills just feels fatigue Eyes: No visual changes. ENT: No sore throat. Cardiovascular: Denies chest pain.  No palpitations. Respiratory: Denies shortness of breath. Gastrointestinal: No abdominal pain.   Genitourinary: Negative for dysuria. Musculoskeletal: Negative for back pain. Skin: Negative for rash. Neurological: Negative for headaches, areas of focal weakness or numbness.    ____________________________________________   PHYSICAL EXAM:  VITAL SIGNS: ED Triage Vitals  Enc Vitals Group     BP 07/31/18 1550 (!) 172/69     Pulse Rate 07/31/18 1550 66     Resp 07/31/18 1550 16     Temp 07/31/18 1550 98.6 F (37 C)     Temp Source 07/31/18 1550 Oral  SpO2 07/31/18 1550 97 %     Weight 07/31/18 1550 135 lb (61.2 kg)     Height 07/31/18 1550 5\' 2"  (1.575 m)     Head Circumference --      Peak Flow --      Pain Score 07/31/18 1554 0     Pain Loc --      Pain Edu? --      Excl. in GC? --     Constitutional: Alert and oriented. Well appearing and in no acute distress.  She is ambulating in the room, upon my entry she tells me she wants to just hurry up and be able to get home and just wants her medications refilled. Eyes:  Conjunctivae are normal. Head: Atraumatic. Nose: No congestion/rhinnorhea. Mouth/Throat: Mucous membranes are moist. Neck: No stridor.  Cardiovascular: Normal rate, regular rhythm. Grossly normal heart sounds.  Good peripheral circulation. Respiratory: Normal respiratory effort.  No retractions. Lungs CTAB. Gastrointestinal: Soft and nontender. No distention. Musculoskeletal: No lower extremity tenderness nor edema. Neurologic:  Normal speech and language. No gross focal neurologic deficits are appreciated.  Ambulates with any difficulty. Skin:  Skin is warm, dry and intact. No rash noted. Psychiatric: Mood and affect are normal but seems somewhat impatient. Speech and behavior are normal.  ____________________________________________   LABS (all labs ordered are listed, but only abnormal results are displayed)  Labs Reviewed  BASIC METABOLIC PANEL - Abnormal; Notable for the following components:      Result Value   Potassium 3.2 (*)    All other components within normal limits  CBC  GLUCOSE, CAPILLARY  MAGNESIUM  CBG MONITORING, ED   ____________________________________________  EKG  Reviewed interpreted by me and discussed with cardiology Dr. Ladona Ridgel, EKG from 1555 Heart rate 65 QRS 80 QTc 07/06/2018 Normal sinus rhythm, but markedly prolonged QT interval.  Compared with the previous EKG she does have a pre-existing evidence of QT prolongation in the past as well ____________________________________________  RADIOLOGY  No results found.   ____________________________________________   PROCEDURES  Procedure(s) performed: None  Procedures  Critical Care performed: No  ____________________________________________   INITIAL IMPRESSION / ASSESSMENT AND PLAN / ED COURSE  Pertinent labs & imaging results that were available during my care of the patient were reviewed by me and considered in my medical decision making (see chart for details).   Patient presents  reporting fatigue and would like her medications refilled.  She had an EKG performed for her fatigue and shows a markedly prolonged QT, but in review of her previous records this appears to been chronic, EKG from March 15, 2014 definitely demonstrating same.  Clinical Course as of Aug 01 14  Fri Jul 31, 2018  [id number] Consult placed to Dr. Ladona Ridgel (long QT)   [MQ]  1952 Dr. Ladona Ridgel advises STOP dilantin. Also stop her HCTZ. Start lopressor at 12.5 BID. Needs follow-up with Dr. Graciela Husbands, cardiology. Advices dilantin is not a strong QT prolonging drug though.   Patient has had no symptoms of long QT (syncope, family history of sudden unexplained death).    [MQ]  1955 Dr. Ladona Ridgel also reviewed EKG with me.    [MQ]    Clinical Course User Index [MQ] Sharyn Creamer, MD   Patient notified me as she was walking on the hallway that she had to leave.  She told me that she needed to communicate with someone who been waiting in the car for "8 to 10 hours" for her.  I offered to speak to  them, but patient did not wish for this.  We also offered to have her ride come in and speak with her in her room, but she reports she just wants to go.  She did not want to stay any longer and did not want a wait for me to discuss her medications with neurology or changes in her medications.  Reports she is unsatisfied with the wait time and just needs to leave.  I did offer her and recommended that she follow-up with a cardiologist, whether or not she will do that I do not know.  Patient eloped midway through her evaluation refusing to stay for further work-up though I did tell her that she is at risk for having sudden heart problem or potentially "heart attack" related to her abnormal EKG and low potassium, but she refused further evaluation.  She was fully alert and oriented no distress ambulatory.    ____________________________________________   FINAL CLINICAL IMPRESSION(S) / ED DIAGNOSES  Final diagnoses:  None   fatigue Prolonged QT on EKG      Note:  This document was prepared using Dragon voice recognition software and may include unintentional dictation errors       Sharyn Creamer, MD 08/01/18 0017

## 2018-08-03 ENCOUNTER — Emergency Department
Admission: EM | Admit: 2018-08-03 | Discharge: 2018-08-03 | Disposition: A | Discharge status: Home / Self Care | Payer: Self-pay | Attending: Emergency Medicine | Admitting: Emergency Medicine

## 2018-08-03 ENCOUNTER — Other Ambulatory Visit: Payer: Self-pay

## 2018-08-03 DIAGNOSIS — I10 Essential (primary) hypertension: Secondary | ICD-10-CM | POA: Insufficient documentation

## 2018-08-03 DIAGNOSIS — R531 Weakness: Secondary | ICD-10-CM | POA: Insufficient documentation

## 2018-08-03 DIAGNOSIS — F17210 Nicotine dependence, cigarettes, uncomplicated: Secondary | ICD-10-CM | POA: Insufficient documentation

## 2018-08-03 LAB — BASIC METABOLIC PANEL
Anion gap: 8 (ref 5–15)
BUN: 11 mg/dL (ref 6–20)
CO2: 27 mmol/L (ref 22–32)
Calcium: 9 mg/dL (ref 8.9–10.3)
Chloride: 107 mmol/L (ref 98–111)
Creatinine, Ser: 0.78 mg/dL (ref 0.44–1.00)
GFR calc Af Amer: >60 mL/min (ref >60)
GFR calc non Af Amer: >60 mL/min (ref >60)
Glucose, Bld: 101 mg/dL — ABNORMAL HIGH (ref 70–99)
Potassium: 3.6 mmol/L (ref 3.5–5.1)
Sodium: 142 mmol/L (ref 135–145)

## 2018-08-03 LAB — CBC
HCT: 39.6 % (ref 36.0–46.0)
Hemoglobin: 13.7 g/dL (ref 12.0–15.0)
MCH: 33 pg (ref 26.0–34.0)
MCHC: 34.6 g/dL (ref 30.0–36.0)
MCV: 95.4 fL (ref 80.0–100.0)
Platelets: 272 10*3/uL (ref 150–400)
RBC: 4.15 MIL/uL (ref 3.87–5.11)
RDW: 13.2 % (ref 11.5–15.5)
WBC: 4.7 10*3/uL (ref 4.0–10.5)
nRBC: 0 % (ref 0.0–0.2)

## 2018-08-03 MED ORDER — PHENYTOIN SODIUM EXTENDED 100 MG PO CAPS: 300.0000 mg | ORAL_CAPSULE | Freq: Every day | ORAL | 0 refills | Status: DC

## 2018-08-03 MED ORDER — METOPROLOL TARTRATE 50 MG PO TABS: 50.0000 mg | ORAL_TABLET | Freq: Every day | ORAL | 11 refills | Status: AC

## 2018-08-03 MED ORDER — LISINOPRIL-HYDROCHLOROTHIAZIDE 20-12.5 MG PO TABS: 1.0000 | ORAL_TABLET | Freq: Every day | ORAL | 0 refills | Status: DC

## 2018-08-03 MED ORDER — PHENYTOIN SODIUM EXTENDED 100 MG PO CAPS: 300.0000 mg | ORAL_CAPSULE | Freq: Every day | ORAL | 0 refills | Status: AC

## 2018-08-03 MED ORDER — LISINOPRIL-HYDROCHLOROTHIAZIDE 20-12.5 MG PO TABS: 1.0000 | ORAL_TABLET | Freq: Every day | ORAL | 0 refills | Status: AC

## 2018-08-03 MED ORDER — SODIUM CHLORIDE 0.9% FLUSH: 3.0000 mL | Freq: Once | INTRAVENOUS | Status: DC

## 2018-08-03 MED ORDER — METOPROLOL TARTRATE 50 MG PO TABS: 50.0000 mg | ORAL_TABLET | Freq: Every day | ORAL | 11 refills | Status: DC

## 2018-08-03 NOTE — ED Notes (Signed)
Pt made aware that a urine sample is needed, advised to call for help to toilet if needed

## 2018-08-03 NOTE — ED Triage Notes (Signed)
Pt c/o generalized weakness for the past 2 weeks, states she was here last week and told something was wrong with my heart. Denies any pain or recent illness.Marland Kitchen

## 2018-08-03 NOTE — ED Notes (Signed)
Pt resting in bed, A&O x 4. No c/o pain, c/o "weakness all over", pt has been afebrile

## 2018-08-03 NOTE — ED Notes (Signed)
Pt sleeping at this time, call bell in reach.

## 2018-08-03 NOTE — ED Provider Notes (Signed)
[facility] Emergency Department Provider Note   ____________________________________________    I have reviewed the triage vital signs and the nursing notes.   HISTORY  Chief Complaint Weakness     HPI Angelica Stevens is a 58 y.o. female with a history of hypertension and seizures who presents with complaints of generalized weakness.  Patient was seen recently in the emergency department found to have long QT syndrome.  The patient is back today because she eloped during that visit and wants to be checked out again because "they told me something was wrong with my heart."  Patient is noncompliant with medications.  She denies arrhythmias palpitations chest pain shortness of breath.  Currently she feels well just "tired ".  Past Medical History:  Diagnosis Date  . Hypertension   . Seizures (HCC)     There are no active problems to display for this patient.   History reviewed. No pertinent surgical history.  Prior to Admission medications   Medication Sig Start Date End Date Taking? Authorizing Provider  lisinopril-hydrochlorothiazide (ZESTORETIC) 20-12.5 MG tablet Take 1 tablet by mouth daily. 08/03/18 08/03/19  Jene EveryKinner, Nayeliz Hipp, MD  metoprolol tartrate (LOPRESSOR) 50 MG tablet Take 1 tablet (50 mg total) by mouth daily. 08/03/18 08/03/19  Jene EveryKinner, Quint Chestnut, MD  phenytoin (DILANTIN) 100 MG ER capsule Take 3 capsules (300 mg total) by mouth at bedtime. 08/03/18 08/03/19  Jene EveryKinner, Phallon Haydu, MD     Allergies Patient has no known allergies.  No family history on file.  Social History Social History   Tobacco Use  . Smoking status: Current Every Day Smoker  . Smokeless tobacco: Never Used  Substance Use Topics  . Alcohol use: Yes  . Drug use: Not on file    Review of Systems  Constitutional: As above Eyes: No visual changes.  ENT: No sore throat. Cardiovascular: As above Respiratory: Denies shortness of breath. Gastrointestinal: No abdominal pain.  No  nausea, no vomiting.   Genitourinary: Negative for dysuria. Musculoskeletal: Negative for back pain. Skin: Negative for rash. Neurological: Negative for headaches    ____________________________________________   PHYSICAL EXAM:  VITAL SIGNS: ED Triage Vitals  Enc Vitals Group     BP 08/03/18 1025 (!) 157/69     Pulse Rate 08/03/18 1025 64     Resp 08/03/18 1130 (!) 26     Temp 08/03/18 1025 98.6 F (37 C)     Temp Source 08/03/18 1025 Oral     SpO2 08/03/18 1025 96 %     Weight 08/03/18 1016 59 kg (130 lb)     Height 08/03/18 1016 1.575 m (5\' 2" )     Head Circumference --      Peak Flow --      Pain Score 08/03/18 1016 0     Pain Loc --      Pain Edu? --      Excl. in GC? --     Constitutional: Alert and oriented.  Eyes: Conjunctivae are normal.   Nose: No congestion/rhinnorhea. Mouth/Throat: Mucous membranes are moist.    Cardiovascular: Normal rate, regular rhythm. Grossly normal heart sounds.  Good peripheral circulation. Respiratory: Normal respiratory effort.  No retractions. Lungs CTAB. Gastrointestinal: Soft and nontender. No distention.  No CVA tenderness. Genitourinary: deferred Musculoskeletal: No lower extremity tenderness nor edema.  Warm and well perfused Neurologic:  Normal speech and language. No gross focal neurologic deficits are appreciated.  Skin:  Skin is warm, dry and intact. No rash noted. Psychiatric: Mood  and affect are normal. Speech and behavior are normal.  ____________________________________________   LABS (all labs ordered are listed, but only abnormal results are displayed)  Labs Reviewed  BASIC METABOLIC PANEL - Abnormal; Notable for the following components:      Result Value   Glucose, Bld 101 (*)    All other components within normal limits  CBC  CBG MONITORING, ED   ____________________________________________  EKG  ED ECG REPORT I, Jene Every, the attending physician, personally viewed and interpreted this ECG.   Date: 08/03/2018  Rhythm: normal sinus rhythm QRS Axis: normal Intervals: Prolonged QT ST/T Wave abnormalities: normal Narrative Interpretation: no evidence of acute ischemia  ____________________________________________  RADIOLOGY  None ____________________________________________   PROCEDURES  Procedure(s) performed: No  Procedures   Critical Care performed: No ____________________________________________   INITIAL IMPRESSION / ASSESSMENT AND PLAN / ED COURSE  Pertinent labs & imaging results that were available during my care of the patient were reviewed by me and considered in my medical decision making (see chart for details).  Patient presents with generalized fatigue/weakness, we will check labs to evaluate electrolytes, kidney function, blood counts/anemia.  No fevers or chills or cough or shortness of breath.   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  Given that she is not taking her medications unlikely this is caused by her home medications.  He is reassured by unremarkable lab work.  We will refill medications have her follow-up closely with cardiology for further evaluation    ____________________________________________   FINAL CLINICAL IMPRESSION(S) / ED DIAGNOSES  Final diagnoses:  Generalized weakness        Note:  This document was prepared using Dragon voice recognition software and may include unintentional dictation errors.   Jene Every, MD 08/03/18 1320

## 2020-02-01 ENCOUNTER — Inpatient Hospital Stay (HOSPITAL_COMMUNITY): Payer: Medicaid Other

## 2020-02-01 ENCOUNTER — Emergency Department: Payer: Medicaid Other

## 2020-02-01 ENCOUNTER — Inpatient Hospital Stay (HOSPITAL_COMMUNITY)
Admission: AD | Admit: 2020-02-01 | Discharge: 2020-03-01 | DRG: 100 | Disposition: E | Payer: Medicaid Other | Source: Other Acute Inpatient Hospital | Attending: Pulmonary Disease | Admitting: Pulmonary Disease

## 2020-02-01 ENCOUNTER — Other Ambulatory Visit: Payer: Self-pay

## 2020-02-01 ENCOUNTER — Emergency Department
Admission: EM | Admit: 2020-02-01 | Discharge: 2020-02-01 | Disposition: A | Discharge status: Other Acute Inpatient Hospital | Payer: Medicaid Other | Attending: Student in an Organized Health Care Education/Training Program | Admitting: Student in an Organized Health Care Education/Training Program

## 2020-02-01 DIAGNOSIS — G931 Anoxic brain damage, not elsewhere classified: Secondary | ICD-10-CM | POA: Diagnosis present

## 2020-02-01 DIAGNOSIS — Z66 Do not resuscitate: Secondary | ICD-10-CM | POA: Diagnosis not present

## 2020-02-01 DIAGNOSIS — J9601 Acute respiratory failure with hypoxia: Secondary | ICD-10-CM

## 2020-02-01 DIAGNOSIS — D509 Iron deficiency anemia, unspecified: Secondary | ICD-10-CM | POA: Diagnosis present

## 2020-02-01 DIAGNOSIS — I469 Cardiac arrest, cause unspecified: Secondary | ICD-10-CM | POA: Diagnosis present

## 2020-02-01 DIAGNOSIS — Z4659 Encounter for fitting and adjustment of other gastrointestinal appliance and device: Principal | ICD-10-CM

## 2020-02-01 DIAGNOSIS — R739 Hyperglycemia, unspecified: Secondary | ICD-10-CM

## 2020-02-01 DIAGNOSIS — J9602 Acute respiratory failure with hypercapnia: Secondary | ICD-10-CM | POA: Diagnosis present

## 2020-02-01 DIAGNOSIS — I468 Cardiac arrest due to other underlying condition: Secondary | ICD-10-CM | POA: Diagnosis present

## 2020-02-01 DIAGNOSIS — I959 Hypotension, unspecified: Secondary | ICD-10-CM | POA: Diagnosis present

## 2020-02-01 DIAGNOSIS — G40911 Epilepsy, unspecified, intractable, with status epilepticus: Principal | ICD-10-CM | POA: Diagnosis present

## 2020-02-01 DIAGNOSIS — E876 Hypokalemia: Secondary | ICD-10-CM | POA: Diagnosis present

## 2020-02-01 DIAGNOSIS — Z515 Encounter for palliative care: Secondary | ICD-10-CM

## 2020-02-01 DIAGNOSIS — G9340 Encephalopathy, unspecified: Secondary | ICD-10-CM

## 2020-02-01 DIAGNOSIS — Z9289 Personal history of other medical treatment: Secondary | ICD-10-CM

## 2020-02-01 DIAGNOSIS — Z20822 Contact with and (suspected) exposure to covid-19: Secondary | ICD-10-CM | POA: Diagnosis present

## 2020-02-01 DIAGNOSIS — N179 Acute kidney failure, unspecified: Secondary | ICD-10-CM | POA: Diagnosis present

## 2020-02-01 DIAGNOSIS — R34 Anuria and oliguria: Secondary | ICD-10-CM | POA: Diagnosis present

## 2020-02-01 DIAGNOSIS — E781 Pure hyperglyceridemia: Secondary | ICD-10-CM | POA: Diagnosis present

## 2020-02-01 DIAGNOSIS — Z452 Encounter for adjustment and management of vascular access device: Secondary | ICD-10-CM

## 2020-02-01 DIAGNOSIS — Z9114 Patient's other noncompliance with medication regimen: Secondary | ICD-10-CM

## 2020-02-01 DIAGNOSIS — F1410 Cocaine abuse, uncomplicated: Secondary | ICD-10-CM | POA: Diagnosis present

## 2020-02-01 DIAGNOSIS — I10 Essential (primary) hypertension: Secondary | ICD-10-CM | POA: Diagnosis present

## 2020-02-01 DIAGNOSIS — D72828 Other elevated white blood cell count: Secondary | ICD-10-CM | POA: Diagnosis present

## 2020-02-01 DIAGNOSIS — Z9911 Dependence on respirator [ventilator] status: Secondary | ICD-10-CM | POA: Diagnosis not present

## 2020-02-01 DIAGNOSIS — G40909 Epilepsy, unspecified, not intractable, without status epilepticus: Secondary | ICD-10-CM | POA: Diagnosis not present

## 2020-02-01 DIAGNOSIS — G253 Myoclonus: Secondary | ICD-10-CM | POA: Diagnosis not present

## 2020-02-01 DIAGNOSIS — G9349 Other encephalopathy: Secondary | ICD-10-CM | POA: Diagnosis not present

## 2020-02-01 LAB — POCT I-STAT 7, (LYTES, BLD GAS, ICA,H+H)
Acid-base deficit: 2 mmol/L (ref 0.0–2.0)
Bicarbonate: 20.2 mmol/L (ref 20.0–28.0)
Calcium, Ion: 1.09 mmol/L — ABNORMAL LOW (ref 1.15–1.40)
HCT: 39 % (ref 36.0–46.0)
Hemoglobin: 13.3 g/dL (ref 12.0–15.0)
O2 Saturation: 98 %
Patient temperature: 38
Potassium: 2.2 mmol/L — CL (ref 3.5–5.1)
Sodium: 141 mmol/L (ref 135–145)
TCO2: 21 mmol/L — ABNORMAL LOW (ref 22–32)
pCO2 arterial: 28.2 mmHg — ABNORMAL LOW (ref 32.0–48.0)
pH, Arterial: 7.467 — ABNORMAL HIGH (ref 7.350–7.450)
pO2, Arterial: 103 mmHg (ref 83.0–108.0)

## 2020-02-01 LAB — CBC WITH DIFFERENTIAL/PLATELET
Abs Immature Granulocytes: 0.84 10*3/uL — ABNORMAL HIGH (ref 0.00–0.07)
Basophils Absolute: 0.1 10*3/uL (ref 0.0–0.1)
Basophils Relative: 1 %
Eosinophils Absolute: 0.1 10*3/uL (ref 0.0–0.5)
Eosinophils Relative: 1 %
HCT: 39 % (ref 36.0–46.0)
Hemoglobin: 13 g/dL (ref 12.0–15.0)
Immature Granulocytes: 4 %
Lymphocytes Relative: 43 %
Lymphs Abs: 9.1 10*3/uL — ABNORMAL HIGH (ref 0.7–4.0)
MCH: 33.3 pg (ref 26.0–34.0)
MCHC: 33.3 g/dL (ref 30.0–36.0)
MCV: 100 fL (ref 80.0–100.0)
Monocytes Absolute: 0.9 10*3/uL (ref 0.1–1.0)
Monocytes Relative: 4 %
Neutro Abs: 10.4 10*3/uL — ABNORMAL HIGH (ref 1.7–7.7)
Neutrophils Relative %: 47 %
Platelets: 382 10*3/uL (ref 150–400)
RBC: 3.9 MIL/uL (ref 3.87–5.11)
RDW: 14 % (ref 11.5–15.5)
Smear Review: NORMAL
WBC: 21.5 10*3/uL — ABNORMAL HIGH (ref 4.0–10.5)
nRBC: 0.2 % (ref 0.0–0.2)

## 2020-02-01 LAB — URINALYSIS, COMPLETE (UACMP) WITH MICROSCOPIC
Bilirubin Urine: NEGATIVE
Glucose, UA: >=500 mg/dL — AB
Ketones, ur: NEGATIVE mg/dL
Leukocytes,Ua: NEGATIVE
Nitrite: NEGATIVE
Protein, ur: 100 mg/dL — AB
Specific Gravity, Urine: 1.007 (ref 1.005–1.030)
pH: 7 (ref 5.0–8.0)

## 2020-02-01 LAB — URINE DRUG SCREEN, QUALITATIVE (ARMC ONLY)
Amphetamines, Ur Screen: NOT DETECTED
Barbiturates, Ur Screen: NOT DETECTED
Benzodiazepine, Ur Scrn: NOT DETECTED
Cannabinoid 50 Ng, Ur ~~LOC~~: NOT DETECTED
Cocaine Metabolite,Ur ~~LOC~~: POSITIVE — AB
MDMA (Ecstasy)Ur Screen: NOT DETECTED
Methadone Scn, Ur: NOT DETECTED
Opiate, Ur Screen: NOT DETECTED
Phencyclidine (PCP) Ur S: NOT DETECTED
Tricyclic, Ur Screen: NOT DETECTED

## 2020-02-01 LAB — COMPREHENSIVE METABOLIC PANEL
ALT: 20 U/L (ref 0–44)
AST: 76 U/L — ABNORMAL HIGH (ref 15–41)
Albumin: 3.7 g/dL (ref 3.5–5.0)
Alkaline Phosphatase: 95 U/L (ref 38–126)
Anion gap: 22 — ABNORMAL HIGH (ref 5–15)
BUN: 11 mg/dL (ref 6–20)
CO2: 15 mmol/L — ABNORMAL LOW (ref 22–32)
Calcium: 8.5 mg/dL — ABNORMAL LOW (ref 8.9–10.3)
Chloride: 98 mmol/L (ref 98–111)
Creatinine, Ser: 1.23 mg/dL — ABNORMAL HIGH (ref 0.44–1.00)
GFR, Estimated: 30 mL/min — ABNORMAL LOW (ref >60)
Glucose, Bld: 348 mg/dL — ABNORMAL HIGH (ref 70–99)
Potassium: 3 mmol/L — ABNORMAL LOW (ref 3.5–5.1)
Sodium: 135 mmol/L (ref 135–145)
Total Bilirubin: 1.2 mg/dL (ref 0.3–1.2)
Total Protein: 8 g/dL (ref 6.5–8.1)

## 2020-02-01 LAB — BLOOD GAS, ARTERIAL
Acid-base deficit: 5.5 mmol/L — ABNORMAL HIGH (ref 0.0–2.0)
Bicarbonate: 22.6 mmol/L (ref 20.0–28.0)
FIO2: 0.6
MECHVT: 420 mL
O2 Saturation: 91.5 %
PEEP: 5 cmH2O
Patient temperature: 37
RATE: 16 resp/min
pCO2 arterial: 54 mmHg — ABNORMAL HIGH (ref 32.0–48.0)
pH, Arterial: 7.23 — ABNORMAL LOW (ref 7.350–7.450)
pO2, Arterial: 74 mmHg — ABNORMAL LOW (ref 83.0–108.0)

## 2020-02-01 LAB — BASIC METABOLIC PANEL
Anion gap: 14 (ref 5–15)
BUN: 21 mg/dL — ABNORMAL HIGH (ref 6–20)
CO2: 22 mmol/L (ref 22–32)
Calcium: 8.3 mg/dL — ABNORMAL LOW (ref 8.9–10.3)
Chloride: 102 mmol/L (ref 98–111)
Creatinine, Ser: 1.92 mg/dL — ABNORMAL HIGH (ref 0.44–1.00)
GFR, Estimated: 30 mL/min — ABNORMAL LOW (ref >60)
Glucose, Bld: 115 mg/dL — ABNORMAL HIGH (ref 70–99)
Potassium: 2.2 mmol/L — CL (ref 3.5–5.1)
Sodium: 138 mmol/L (ref 135–145)

## 2020-02-01 LAB — MRSA PCR SCREENING: MRSA by PCR: NEGATIVE

## 2020-02-01 LAB — RESPIRATORY PANEL BY RT PCR (FLU A&B, COVID)
Influenza A by PCR: NEGATIVE
Influenza B by PCR: NEGATIVE
SARS Coronavirus 2 by RT PCR: NEGATIVE

## 2020-02-01 LAB — PROTIME-INR
INR: 1.1 (ref 0.8–1.2)
Prothrombin Time: 13.5 seconds (ref 11.4–15.2)

## 2020-02-01 LAB — PATHOLOGIST SMEAR REVIEW

## 2020-02-01 LAB — GLUCOSE, CAPILLARY
Glucose-Capillary: 121 mg/dL — ABNORMAL HIGH (ref 70–99)
Glucose-Capillary: 121 mg/dL — ABNORMAL HIGH (ref 70–99)
Glucose-Capillary: 116 mg/dL — ABNORMAL HIGH (ref 70–99)

## 2020-02-01 LAB — TROPONIN I (HIGH SENSITIVITY)
Troponin I (High Sensitivity): 54 ng/L — ABNORMAL HIGH (ref <18)
Troponin I (High Sensitivity): 180 ng/L (ref <18)

## 2020-02-01 LAB — APTT: aPTT: 28 seconds (ref 24–36)

## 2020-02-01 LAB — ETHANOL: Alcohol, Ethyl (B): <10 mg/dL (ref <10)

## 2020-02-01 LAB — PHENYTOIN LEVEL, TOTAL: Phenytoin Lvl: <2.5 ug/mL — ABNORMAL LOW (ref 10.0–20.0)

## 2020-02-01 MED ORDER — PROPOFOL 1000 MG/100ML IV EMUL
5.0000 ug/kg/min | INTRAVENOUS | Status: DC
  Administered 2020-02-01: 30 ug/kg/min via INTRAVENOUS
  Administered 2020-02-01: 70 ug/kg/min via INTRAVENOUS
  Filled 2020-02-01: qty 100

## 2020-02-01 MED ORDER — ORAL CARE MOUTH RINSE
15.0000 mL | OROMUCOSAL | Status: DC
  Administered 2020-02-01 – 2020-02-05 (×38): 15 mL via OROMUCOSAL

## 2020-02-01 MED ORDER — HEPARIN SODIUM (PORCINE) 5000 UNIT/ML IJ SOLN
5000.0000 [IU] | Freq: Three times a day (TID) | INTRAMUSCULAR | Status: DC
  Administered 2020-02-01 – 2020-02-05 (×11): 5000 [IU] via SUBCUTANEOUS
  Filled 2020-02-01 (×12): qty 1

## 2020-02-01 MED ORDER — DEXTROSE 50 % IV SOLN: 0.0000 mL | INTRAVENOUS | Status: DC | PRN

## 2020-02-01 MED ORDER — INSULIN ASPART 100 UNIT/ML ~~LOC~~ SOLN: 2.0000 [IU] | SUBCUTANEOUS | Status: DC

## 2020-02-01 MED ORDER — ETOMIDATE 2 MG/ML IV SOLN
INTRAVENOUS | Status: AC | PRN
  Administered 2020-02-01: 20 mg via INTRAVENOUS

## 2020-02-01 MED ORDER — PROPOFOL 1000 MG/100ML IV EMUL
25.0000 ug/kg/min | INTRAVENOUS | Status: DC
  Administered 2020-02-02 (×2): 80 ug/kg/min via INTRAVENOUS
  Administered 2020-02-02: 60 ug/kg/min via INTRAVENOUS
  Administered 2020-02-02: 50 ug/kg/min via INTRAVENOUS
  Administered 2020-02-02 (×2): 80 ug/kg/min via INTRAVENOUS
  Administered 2020-02-02: 50 ug/kg/min via INTRAVENOUS
  Administered 2020-02-03 (×2): 80 ug/kg/min via INTRAVENOUS
  Filled 2020-02-01 (×5): qty 100
  Filled 2020-02-01: qty 200
  Filled 2020-02-01 (×2): qty 100

## 2020-02-01 MED ORDER — PROPOFOL 1000 MG/100ML IV EMUL
INTRAVENOUS | Status: AC | PRN
  Administered 2020-02-01: 30 ug/kg/min via INTRAVENOUS

## 2020-02-01 MED ORDER — METRONIDAZOLE IN NACL 5-0.79 MG/ML-% IV SOLN
500.0000 mg | Freq: Once | INTRAVENOUS | Status: AC
  Administered 2020-02-01: 500 mg via INTRAVENOUS
  Filled 2020-02-01: qty 100

## 2020-02-01 MED ORDER — PANTOPRAZOLE SODIUM 40 MG IV SOLR
40.0000 mg | Freq: Every day | INTRAVENOUS | Status: DC
  Administered 2020-02-01 – 2020-02-04 (×4): 40 mg via INTRAVENOUS
  Filled 2020-02-01 (×4): qty 40

## 2020-02-01 MED ORDER — ROCURONIUM BROMIDE 50 MG/5ML IV SOLN
INTRAVENOUS | Status: AC | PRN
  Administered 2020-02-01: 100 mg via INTRAVENOUS

## 2020-02-01 MED ORDER — LACTATED RINGERS IV SOLN: INTRAVENOUS | Status: AC

## 2020-02-01 MED ORDER — FENTANYL CITRATE (PF) 100 MCG/2ML IJ SOLN
50.0000 ug | Freq: Once | INTRAMUSCULAR | Status: AC
  Administered 2020-02-01: 50 ug via INTRAVENOUS

## 2020-02-01 MED ORDER — SODIUM CHLORIDE 0.9% FLUSH
10.0000 mL | Freq: Two times a day (BID) | INTRAVENOUS | Status: DC
  Administered 2020-02-02: 10 mL
  Administered 2020-02-02: 20 mL
  Administered 2020-02-03 – 2020-02-05 (×5): 10 mL

## 2020-02-01 MED ORDER — PROPOFOL 1000 MG/100ML IV EMUL
INTRAVENOUS | Status: AC
  Administered 2020-02-01: 40 ug/kg/min via INTRAVENOUS
  Filled 2020-02-01: qty 100

## 2020-02-01 MED ORDER — LABETALOL HCL 5 MG/ML IV SOLN: 5.0000 mg | Freq: Once | INTRAVENOUS | Status: DC

## 2020-02-01 MED ORDER — PHENYTOIN 125 MG/5ML PO SUSP
300.0000 mg | Freq: Every day | ORAL | Status: DC
  Administered 2020-02-02 – 2020-02-04 (×3): 300 mg
  Filled 2020-02-01 (×4): qty 12

## 2020-02-01 MED ORDER — CHLORHEXIDINE GLUCONATE CLOTH 2 % EX PADS
6.0000 | MEDICATED_PAD | Freq: Every day | CUTANEOUS | Status: DC
  Administered 2020-02-01 – 2020-02-05 (×5): 6 via TOPICAL

## 2020-02-01 MED ORDER — ASPIRIN 300 MG RE SUPP
300.0000 mg | RECTAL | Status: AC
  Administered 2020-02-01: 300 mg via RECTAL
  Filled 2020-02-01: qty 1

## 2020-02-01 MED ORDER — FENTANYL 2500MCG IN NS 250ML (10MCG/ML) PREMIX INFUSION
100.0000 ug/h | INTRAVENOUS | Status: DC
  Administered 2020-02-01 – 2020-02-02 (×2): 100 ug/h via INTRAVENOUS
  Administered 2020-02-03 – 2020-02-04 (×5): 400 ug/h via INTRAVENOUS
  Filled 2020-02-01 (×7): qty 250

## 2020-02-01 MED ORDER — LORAZEPAM 2 MG/ML IJ SOLN
2.0000 mg | Freq: Once | INTRAMUSCULAR | Status: AC
  Administered 2020-02-01: 2 mg via INTRAVENOUS
  Filled 2020-02-01: qty 1

## 2020-02-01 MED ORDER — SODIUM CHLORIDE 0.9 % IV SOLN
2.0000 g | Freq: Once | INTRAVENOUS | Status: AC
  Administered 2020-02-01: 2 g via INTRAVENOUS
  Filled 2020-02-01: qty 2

## 2020-02-01 MED ORDER — POTASSIUM CHLORIDE 20 MEQ/15ML (10%) PO SOLN
40.0000 meq | Freq: Once | ORAL | Status: AC
  Administered 2020-02-01: 40 meq
  Filled 2020-02-01: qty 30

## 2020-02-01 MED ORDER — SODIUM CHLORIDE 0.9% FLUSH
10.0000 mL | INTRAVENOUS | Status: DC | PRN
  Administered 2020-02-01: 30 mL

## 2020-02-01 MED ORDER — VANCOMYCIN HCL 1500 MG/300ML IV SOLN
1500.0000 mg | Freq: Once | INTRAVENOUS | Status: DC
  Filled 2020-02-01: qty 300

## 2020-02-01 MED ORDER — CHLORHEXIDINE GLUCONATE 0.12% ORAL RINSE (MEDLINE KIT)
15.0000 mL | Freq: Two times a day (BID) | OROMUCOSAL | Status: DC
  Administered 2020-02-01 – 2020-02-05 (×8): 15 mL via OROMUCOSAL

## 2020-02-01 MED ORDER — FENTANYL CITRATE (PF) 100 MCG/2ML IJ SOLN
25.0000 ug | Freq: Once | INTRAMUSCULAR | Status: AC
  Administered 2020-02-01: 25 ug via INTRAVENOUS
  Filled 2020-02-01: qty 2

## 2020-02-01 MED ORDER — LACTATED RINGERS IV SOLN: INTRAVENOUS | Status: DC

## 2020-02-01 MED ORDER — NOREPINEPHRINE 4 MG/250ML-% IV SOLN
0.0000 ug/min | INTRAVENOUS | Status: DC
  Administered 2020-02-02: 7 ug/min via INTRAVENOUS
  Filled 2020-02-01 (×2): qty 250

## 2020-02-01 MED ORDER — SODIUM CHLORIDE 0.9 % IV SOLN: 300.0000 mg | Freq: Every day | INTRAVENOUS | Status: DC

## 2020-02-01 MED ORDER — ASPIRIN 300 MG RE SUPP
300.0000 mg | Freq: Once | RECTAL | Status: AC
  Administered 2020-02-01: 300 mg via RECTAL
  Filled 2020-02-01: qty 1

## 2020-02-01 MED ORDER — FENTANYL BOLUS VIA INFUSION
25.0000 ug | INTRAVENOUS | Status: DC | PRN
  Administered 2020-02-02 – 2020-02-03 (×6): 25 ug via INTRAVENOUS
  Filled 2020-02-01: qty 25

## 2020-02-01 MED ORDER — ACETAMINOPHEN 10 MG/ML IV SOLN
1000.0000 mg | Freq: Four times a day (QID) | INTRAVENOUS | Status: DC
  Administered 2020-02-01: 1000 mg via INTRAVENOUS
  Filled 2020-02-01 (×3): qty 100

## 2020-02-01 MED ORDER — VANCOMYCIN HCL IN DEXTROSE 1-5 GM/200ML-% IV SOLN: 1000.0000 mg | Freq: Once | INTRAVENOUS | Status: DC

## 2020-02-01 MED ORDER — FENTANYL CITRATE (PF) 100 MCG/2ML IJ SOLN: 25.0000 ug | Freq: Once | INTRAMUSCULAR | Status: DC

## 2020-02-01 MED ORDER — SODIUM CHLORIDE 0.9 % IV BOLUS
1000.0000 mL | Freq: Once | INTRAVENOUS | Status: AC
  Administered 2020-02-01: 1000 mL via INTRAVENOUS

## 2020-02-01 MED ORDER — INSULIN REGULAR(HUMAN) IN NACL 100-0.9 UT/100ML-% IV SOLN
INTRAVENOUS | Status: DC
  Filled 2020-02-01: qty 100

## 2020-02-01 MED ORDER — SODIUM CHLORIDE 0.9 % IV SOLN: INTRAVENOUS | Status: DC

## 2020-02-01 MED ORDER — POTASSIUM CHLORIDE 20 MEQ/15ML (10%) PO SOLN
40.0000 meq | Freq: Once | ORAL | Status: AC
  Administered 2020-02-01: 40 meq via ORAL
  Filled 2020-02-01: qty 30

## 2020-02-01 NOTE — ED Notes (Signed)
Pt gone to CT 

## 2020-02-01 NOTE — Progress Notes (Signed)
EEG complete - results pending 

## 2020-02-01 NOTE — Progress Notes (Signed)
vLTM started   [facility] to monitor  Event button tested   Neurology notified   Same leads used for baseline eeg

## 2020-02-01 NOTE — ED Triage Notes (Signed)
Changed per MD. Angelica Stevens

## 2020-02-01 NOTE — ED Notes (Signed)
Ice packs placed on patient for cooling process

## 2020-02-01 NOTE — ED Notes (Signed)
Family at bedside. 

## 2020-02-01 NOTE — Consult Note (Signed)
Cardiology Consultation:   Patient ID: Angelica Stevens MRN: [id number]; DOB: 04/01/1874  Admit date: 02/28/2020 Date of Consult: 02/10/2020  Primary Care Provider: No primary care provider on file. [facility] Cardiologist: New - Katie Faraone [facility] Electrophysiologist:  None    Patient Profile:   Angelica Stevens is an 59 year old woman with unknown past medical history, who is being seen at the request of Dr. Roxan Hockey for evaluation of abnormal EKG and cardiac arrest.  History of Present Illness:   Angelica Stevens is currently intubated and unresponsive.  No family members or outside records are available for review.  History is obtained from first responders.  The patient reportedly had a seizure while in the car with a man.  He witnessed her seizures twice and was able to call for help.  When the fire department arrived, the patient was unresponsive and pulseless.  She received 1 shock via AED.  Paramedics subsequently found the patient to be in PEA arrest and administered epinephrine and Narcan with return of spontaneous circulation.  Post ROSC EKG showed sinus tachycardia with LVH and ST elevation in V1 and V2 as well as widespread ST depression.  King airway was placed and the patient transported to the emergency department for further evaluation.  Upon arrival in the ED, the patient intermittently attempted to breathe on her own but did not have purposeful movements.   Past Medical History: Unknown and unable to obtain due to the patient's altered mental status.  Past Surgical History: Unknown and unable to obtain due to the patient's altered mental status.  Home Medications: Unknown.  Allergies:   Not on File  Social History:   Unknown and unable to obtain due to the patient's altered mental status.  Family History:   Unknown and unable to obtain due to the patient's altered mental status.   ROS:  Review of Systems  Unable to perform ROS: Intubated    Physical  Exam/Data:   Vitals:   02/17/2020 1200 02/17/2020 1215 01/31/2020 1223 02/22/2020 1223  BP:  (!) 181/111    Pulse: (!) 108 (!) 101    Resp: 18 17    Temp:  (!) 95.9 F (35.5 C)    SpO2: 100% 95%    Weight:   70 kg   Height:    5\' 9"  (1.753 m)   No intake or output data in the 24 hours ending 02/18/2020 1229 Last 3 Weights 02/04/2020  Weight (lbs) 154 lb 5.2 oz  Weight (kg) 70 kg     Body mass index is 22.79 kg/m.  General: Middle-aged woman, lying on stretcher in the ED.  She is unresponsive. HEENT: ET tube in place. Lymph: no adenopathy Neck: Unable to assess JVP due to positioning and support devices. Endocrine:  No thryomegaly Vascular: No carotid bruits; FA pulses 2+ bilaterally without bruits  Cardiac: Distant heart sounds.  Regular rate and rhythm without murmurs. Lungs: Coarse breath sounds anteriorly with bilateral wheezes. Abd: Hypoactive bowel sounds and distended.  Soft. Ext: no edema Musculoskeletal:  No deformities, BUE and BLE strength normal and equal Skin: warm and dry  Neuro: Patient moves intermittently without purpose. Psych: Unable to assess, as patient is intubated and sedated.  EKG: Initial EKG in the ED shows sinus tachycardia with frequent PVCs (ventricular bigeminy, left ventricular hypertrophy, and widespread ST depressions.  No obvious ST elevation noted.  Repeat EKG shows sinus rhythm with LVH and inferolateral ST depression.  No significant ST elevation noted. Telemetry:  Telemetry was  personally reviewed and demonstrates: Sinus tachycardia with PVCs.  Relevant CV Studies: None  Laboratory Data:  High Sensitivity Troponin:  No results for input(s): TROPONINIHS in the last 720 hours.   ChemistryNo results for input(s): NA, K, CL, CO2, GLUCOSE, BUN, CREATININE, CALCIUM, GFRNONAA, GFRAA, ANIONGAP in the last 168 hours.  No results for input(s): PROT, ALBUMIN, AST, ALT, ALKPHOS, BILITOT in the last 168 hours. HematologyNo results for input(s): WBC, RBC,  HGB, HCT, MCV, MCH, MCHC, RDW, PLT in the last 168 hours. BNPNo results for input(s): BNP, PROBNP in the last 168 hours.  DDimer No results for input(s): DDIMER in the last 168 hours.   Radiology/Studies:  DG Chest Portable 1 View  Result Date: 02/12/20 CLINICAL DATA:  Post arrest.  Intubation EXAM: PORTABLE CHEST 1 VIEW COMPARISON:  No prior. FINDINGS: Endotracheal tube tip noted 3 cm above the carina. NG tube noted with tip over the mid esophagus. Advancement of 20 cm should be considered. Cardiomegaly. Mild bilateral interstitial prominence. Mild CHF cannot be excluded. No pleural effusion or pneumothorax. IMPRESSION: 1. Endotracheal tube tip noted 3 cm above the carina. 2. NG tube noted with tip over the mid esophagus. Advancement of 20 cm should be considered. 3. Cardiomegaly. Mild bilateral interstitial prominence. Mild CHF cannot be excluded. No pneumothorax. Electronically Signed   By: Maisie Fus  Register   On: Feb 12, 2020 12:21    Assessment and Plan:   Cardiac arrest: Etiology of cardiac arrest remains uncertain.  Patient reportedly had a shockable rhythm when first responders first arrived.  When connected to a monitor, PEA was noted.  Initial EKGs showed possible ST elevation though subsequent tracings in the emergency department do not meet STEMI criteria.  I suspect the patient may have had initial global ischemia in the setting of her cardiac arrest.  Given that current EKGs do not meet STEMI criteria and circumstances surrounding the patient's medical history and cardiac arrest are uncertain, I have recommended against emergent cardiac catheterization.  I agree with urgent head CT to exclude intracranial pathology and close monitoring in the ED/ICU.  Targeted temperature management should be considered; I will defer this decision to the ED and critical care teams.  If no contraindication to antiplatelet therapy, aspirin should be administered via OG tube or rectally.  Transthoracic  echocardiogram will be obtained.  High-sensitivity troponin I should also be trended until it has peaked.  I think it would be reasonable to defer anticoagulation with heparin unless there is a marked rise in high-sensitivity troponin I or the patient has other objective findings of ongoing myocardial ischemia.  For questions or updates, please contact [facility] Please consult [email or web address] for contact info under [facility] Cardiology.  Signed, Yvonne Kendall, MD  February 12, 2020 12:29 PM

## 2020-02-01 NOTE — Procedures (Signed)
Central Venous Catheter Insertion Procedure Note  Angelica Stevens  [id number]  06-Jul-1960  Date:02/12/2020  Time:8:33 PM   Provider Performing:Holley Wirt [name]   Procedure: Insertion of Non-tunneled Central Venous Catheter(36556) without US guidance  Indication(s) Medication administration and Difficult access  Consent Risks of the procedure as well as the alternatives and risks of each were explained to the patient and/or caregiver.  Consent for the procedure was obtained and is signed in the bedside chart  Anesthesia Topical only with 1% lidocaine   Timeout Verified patient identification, verified procedure, site/side was marked, verified correct patient position, special equipment/implants available, medications/allergies/relevant history reviewed, required imaging and test results available.  Sterile Technique Maximal sterile technique including full sterile barrier drape, hand hygiene, sterile gown, sterile gloves, mask, hair covering, sterile ultrasound probe cover (if used).  Procedure Description Area of catheter insertion was cleaned with chlorhexidine and draped in sterile fashion.  Without real-time ultrasound guidance a central venous catheter was placed into the left subclavian vein. Nonpulsatile blood flow and easy flushing noted in all ports.  The catheter was sutured in place and sterile dressing applied.  Complications/Tolerance None; patient tolerated the procedure well. Chest X-ray is ordered to verify placement for internal jugular or subclavian cannulation.   Chest x-ray is not ordered for femoral cannulation.  EBL Minimal  Specimen(s) None  Angelica Stevens, [place] Angelica [facility] 02/24/2020, 8:34 PM

## 2020-02-01 NOTE — H&P (Signed)
NAME:  Angelica Stevens, MRN:  [id number], DOB:  04-27-1960, LOS: 0 ADMISSION DATE:  02/02/2020, CONSULTATION DATE:  02/15/2020 REFERRING MD:  Belia Heman, CHIEF COMPLAINT:  seizure   Brief History   59 year old woman admitted to [facility] after PEA arrest.  Transferred to [facility] for further management.  History of present illness   59 year old woman presenting with witnessed seizure activity followed by unresponsiveness.  Unclear downtime.  First responders found wide complex rhythm which they shocked.  Reported 5 minutes downtime but records are not that great.  CT head with 59 age indeterminate left basal ganglia infarct.  UDS positive for cocaine.  Pt transferred to [facility] for further evaluation and management.  Past Medical History  Unknown  Significant Hospital Events   11/2 Transfer to [facility] from [facility]  Consults:   Procedures:   Significant Diagnostic Tests:  CT Head>> 59 age indeterminate left lacunar infarct  Micro Data:  COVID neg  Antimicrobials:  None.   Interim history/subjective:  Sedated.  Once propofol stopped, no purposeful movements but did have intermittent myoclonus of upper right extremity.  Objective   Blood pressure (!) 88/62, pulse 89, resp. rate 20, height 5\' 9"  (1.753 m), SpO2 100 %.    Vent Mode: PRVC FiO2 (%):  [50 %-100 %] 50 % Set Rate:  [16 bmp-18 bmp] 18 bmp Vt Set:  [420 mL-450 mL] 450 mL PEEP:  [5 cmH20] 5 cmH20 Plateau Pressure:  [19 cmH20] 19 cmH20  No intake or output data in the 24 hours ending 02/14/2020 1946 There were no vitals filed for this visit.  Examination: General: Adult female, resting in bed, in NAD. Neuro: Sedate, not responsive. Right gaze deviation. HEENT: [employer]/AT. Sclerae anicteric. ETT in place. Cardiovascular: RRR, no M/R/G.  Lungs: Respirations even and unlabored.  CTA bilaterally, No W/R/R. Abdomen: BS x 4, soft, NT/ND.  Musculoskeletal: No gross deformities, no edema.  Skin: Intact, warm, no rashes.   Assessment & Plan:   Cardiac arrest -  unclear etiology but UDS + for cocaine. - Start TTM, goal 36 degrees. - Place CVL. - Levophed PRN, goal MAP > 70. - Assess CVP's, echo, ethanol. - Trend troponin, lactate. - Hold preadmission zestoretic, lopressor.  Respiratory insufficiency - in the setting of above.  - Full vent support. - Wean as able. - VAP prevention measures. - SBT when mental status allows. - CXR in AM.  At risk for anoxic encephalopathy. - Sedation:  Propofol gtt / Fentanyl gtt. - RASS goal:  -1. - Daily WUA. - Assess EEG. - Neuro consult once rewarmed.  Hx seizures. - Continue home dilantin 300mg  qhs. - EEG. - Neuro consult once rewarmed.  AKI. Hypokalemia. At risk for multiple metabolic derangements during cooling. - 40 mEq K per tube now. - NS @ 75. - Correct electrolytes as indicated. - BMP q12hrs.  Hyperglycemia - anticipate worsening with cooling. - ICU hyperglycemia protocol.   Best practice:  Diet: NPO.  Start TF's in AM. Pain/Anxiety/Delirium protocol (if indicated): Propofol gtt / Fentanyl gtt.  RASS goal -1. VAP protocol (if indicated): In place. DVT prophylaxis: SCD's / heparin. GI prophylaxis: PPI. Glucose control: Insulin gtt. Mobility: Bedrest. Code Status: Full. Family Communication: None available. Disposition: ICU.   Labs   CBC: Recent Labs  Lab 02/04/2020 1150  WBC 21.5*  NEUTROABS 10.4*  HGB 13.0  HCT 39.0  MCV 100.0  PLT 382    Basic Metabolic Panel: Recent Labs  Lab 02/04/2020 1150  NA 135  K 3.0*  CL 98  CO2 15*  GLUCOSE 348*  BUN 11  CREATININE 1.23*  CALCIUM 8.5*   GFR: Estimated Creatinine Clearance: 52.1 mL/min (A) (by C-G formula based on SCr of 1.23 mg/dL (H)). Recent Labs  Lab 02/21/2020 1150  WBC 21.5*    Liver Function Tests: Recent Labs  Lab 02/09/2020 1150  AST 76*  ALT 20  ALKPHOS 95  BILITOT 1.2  PROT 8.0  ALBUMIN 3.7   No results for input(s): LIPASE, AMYLASE in the last 168 hours. No results for input(s): AMMONIA in  the last 168 hours.  ABG    Component Value Date/Time   PHART 7.23 (L) 02/10/2020 1238   PCO2ART 54 (H) 02/07/2020 1238   PO2ART 74 (L) 02/04/2020 1238   HCO3 22.6 02/04/2020 1238   ACIDBASEDEF 5.5 (H) 02/10/2020 1238   O2SAT 91.5 02/18/2020 1238     Coagulation Profile: No results for input(s): INR, PROTIME in the last 168 hours.  Cardiac Enzymes: No results for input(s): CKTOTAL, CKMB, CKMBINDEX, TROPONINI in the last 168 hours.  HbA1C: No results found for: HGBA1C  CBG: No results for input(s): GLUCAP in the last 168 hours.  Review of Systems:   Unable to obtain as pt is encephalopathic.  Past Medical History  She,  has no past medical history on file.   Surgical History   Not available.  Social History      Family History   Her family history is not on file.   Allergies Not on File   Home Medications  Prior to Admission medications   Not on File     Critical care time: 45 min.   Rutherford Guys, [place] [name] [facility] 02/29/2020, 8:14 PM

## 2020-02-01 NOTE — Consult Note (Signed)
Name: Angelica Stevens MRN: [id number] DOB: June 05, 1960     CONSULTATION DATE: 02/21/2020  REFERRING MD : Roxan Hockey  CHIEF COMPLAINT:  Cardiac arrest   HISTORY OF PRESENT ILLNESS:  59 yo female History is obtained from first responders.   The patient reportedly had a seizure while in the car with a man.   He witnessed her seizures twice and was able to call for help.    When the fire department arrived, the patient was unresponsive and pulseless.   She received 1 shock via AED.   Paramedics subsequently found the patient to be in PEA arrest and administered epinephrine and Narcan with return of spontaneous circulation.    Post ROSC EKG showed sinus tachycardia with LVH and ST elevation in V1 and V2 as well as widespread ST depression.  King airway was placed and the patient transported to the emergency department for further evaluation. King airway replaced with ETT  Upon arrival in the ED, the patient intermittently attempted to breathe on her own but did not have purposeful movements. Remains Comatosed  I am very concerned about subclinical status Epilepticus I contacted PCCM at [facility] and addressed need for Continuous EEG monitoring Patient does meet criteria for HYPOTHERMIA PROTOCOL  +COCAINE COVID PENDING ABG SHOWS ACIDOSIS CT HEAD NEG CXR-NO obvious infiltrates    PAST MEDICAL HISTORY :  +HTN  Prior to Admission medications   Not on File    FAMILY HISTORY:  SOCIAL HISTORY:+COCOAINE ABUSE    REVIEW OF SYSTEMS:   Unable to obtain due to critical illness      Estimated body mass index is 22.79 kg/m as calculated from the following:   Height as of this encounter: 5\' 9"  (1.753 m).   Weight as of this encounter: 70 kg.    VITAL SIGNS: Temp:  [95.6 F (35.3 C)-96.1 F (35.6 C)] 96.1 F (35.6 C) (11/02 1330) Pulse Rate:  [45-108] 77 (11/02 1330) Resp:  [17-28] 19 (11/02 1330) BP: (111-181)/(76-111) 111/76 (11/02 1330) SpO2:  [90 %-100 %] 94 % (11/02  1330) FiO2 (%):  [50 %-100 %] 90 % (11/02 1440) Weight:  [70 kg] 70 kg (11/02 1223)   No intake/output data recorded. No intake/output data recorded.   SpO2: 94 % FiO2 (%): 90 %    Initial EKG in the ED shows sinus tachycardia with frequent PVCs (ventricular bigeminy, left ventricular hypertrophy, and widespread ST depressions.  No obvious ST elevation noted.  Repeat EKG shows sinus rhythm with LVH and inferolateral ST depression.  No significant ST elevation noted. Telemetry:  Telemetry was personally reviewed and demonstrates: Sinus tachycardia with PVCs.  Physical Examination:  GENERAL:critically ill appearing, +resp distress HEAD: Normocephalic, atraumatic.  EYES: Pupils equal, round, reactive to light.  No scleral icterus.  MOUTH: Moist mucosal membrane. NECK: Supple. No JVD.  PULMONARY: +rhonchi, +wheezing CARDIOVASCULAR: S1 and S2. Regular rate and rhythm. No murmurs, rubs, or gallops.  GASTROINTESTINAL: Soft, nontender, -distended.  Positive bowel sounds.  MUSCULOSKELETAL: No swelling, clubbing, or edema.  NEUROLOGIC: obtunded SKIN:intact,warm,dry  I personally reviewed lab work that was obtained in last 24 hrs. CXR Independently reviewed-NO obvious infiltrates   MEDICATIONS: I have reviewed all medications and confirmed regimen as documented  Vent Mode: AC FiO2 (%):  [50 %-100 %] 90 % Set Rate:  [16 bmp-18 bmp] 18 bmp Vt Set:  [420 mL-450 mL] 450 mL PEEP:  [5 cmH20] 5 cmH20  CBC    Component Value Date/Time   WBC 21.5 (H) 02-21-2020 1150  RBC 3.90 02/10/20 1150   HGB 13.0 Feb 10, 2020 1150   HCT 39.0 February 10, 2020 1150   PLT 382 02/10/2020 1150   MCV 100.0 02/10/2020 1150   MCH 33.3 10-Feb-2020 1150   MCHC 33.3 10-Feb-2020 1150   RDW 14.0 02-10-20 1150   LYMPHSABS 9.1 (H) 02/10/20 1150   MONOABS 0.9 Feb 10, 2020 1150   EOSABS 0.1 2020/02/10 1150   BASOSABS 0.1 2020-02-10 1150   CMP Latest Ref Rng & Units 2020-02-10  Glucose 70 - 99 mg/dL 546(T)  BUN 6  - 20 mg/dL 11  Creatinine 0.35 - 4.65 mg/dL 6.81(E)  Sodium 751 - 700 mmol/L 135  Potassium 3.5 - 5.1 mmol/L 3.0(L)  Chloride 98 - 111 mmol/L 98  CO2 22 - 32 mmol/L 15(L)  Calcium 8.9 - 10.3 mg/dL 1.7(C)  Total Protein 6.5 - 8.1 g/dL 8.0  Total Bilirubin 0.3 - 1.2 mg/dL 1.2  Alkaline Phos 38 - 126 U/L 95  AST 15 - 41 U/L 76(H)  ALT 0 - 44 U/L 20      IMAGING    CT Head Wo Contrast  Result Date: 02-10-20 CLINICAL DATA:  Mental status changes, unknown cause, became unresponsive after 2 seizures EXAM: CT HEAD WITHOUT CONTRAST TECHNIQUE: Contiguous axial images were obtained from the base of the skull through the vertex without intravenous contrast. Sagittal and coronal MPR images reconstructed from axial data set. COMPARISON:  None FINDINGS: Brain: Slight asymmetry of the lateral ventricles RIGHT slightly larger than LEFT. No hydrocephalus, midline shift or mass effect. Minimal hypoattenuation of the periventricular white matter. Low-attenuation at LEFT basal ganglia question age-indeterminate lacunar infarct. No intracranial hemorrhage, mass lesion or additional infarcts identified. No extra-axial fluid collections. Vascular: No hyperdense vessels. Minimal atherosclerotic calcification of internal carotid arteries bilaterally at skull base Skull: Intact Sinuses/Orbits: Small air-fluid levels in the sphenoid sinus. Remaining paranasal sinuses and mastoid air cells clear Other: N/A IMPRESSION: Minimal small vessel ischemic changes of the periventricular white matter. Low-attenuation at LEFT basal ganglia suspect age-indeterminate lacunar infarct. No other intracranial abnormalities. Air-fluid levels in the sphenoid sinus. Electronically Signed   By: Ulyses Southward M.D.   On: Feb 10, 2020 12:38   DG Chest Portable 1 View  Result Date: 02-10-20 CLINICAL DATA:  Post arrest.  Intubation EXAM: PORTABLE CHEST 1 VIEW COMPARISON:  No prior. FINDINGS: Endotracheal tube tip noted 3 cm above the carina. NG  tube noted with tip over the mid esophagus. Advancement of 20 cm should be considered. Cardiomegaly. Mild bilateral interstitial prominence. Mild CHF cannot be excluded. No pleural effusion or pneumothorax. IMPRESSION: 1. Endotracheal tube tip noted 3 cm above the carina. 2. NG tube noted with tip over the mid esophagus. Advancement of 20 cm should be considered. 3. Cardiomegaly. Mild bilateral interstitial prominence. Mild CHF cannot be excluded. No pneumothorax. Electronically Signed   By: Maisie Fus  Register   On: 02/10/2020 12:21     Indwelling Urinary Catheter continued, requirement due to   Reason to continue Indwelling Urinary Catheter strict Intake/Output monitoring for hemodynamic instability         Ventilator continued, requirement due to severe respiratory failure   Ventilator Sedation RASS 0 to -2      ASSESSMENT AND PLAN SYNOPSIS  59 yo female with acute cardiac arrest from acute cocaine poisoning   Severe ACUTE Hypoxic and Hypercapnic Respiratory Failure -continue Full MV support -continue Bronchodilator Therapy -Wean Fio2 and PEEP as tolerated -VAP/VENT bundle implementation  ACUTE SYSTOLIC CARDIAC FAILURE-due to COCAINE -follow up cardiac enzymes as indicated -  follow up cardiology recs Check ECHO   ACUTE KIDNEY INJURY/Renal Failure -continue Foley Catheter-assess need -Avoid nephrotoxic agents -Follow urine output, BMP -Ensure adequate renal perfusion, optimize oxygenation -Renal dose medications     NEUROLOGY Acute toxic metabolic encephalopathy, need for sedation Goal RASS -2 to -3 Patient needs EEG monitoring Will start HYPOTHERMIA PROTOCOL   CARDIAC ICU monitoring   GI GI PROPHYLAXIS as indicated   ENDO - will use ICU hypoglycemic\Hyperglycemia protocol if needed    ELECTROLYTES -follow labs as needed -replace as needed -pharmacy consultation and following    DVT/GI PRX ordered and assessed TRANSFUSIONS AS NEEDED MONITOR FSBS I  Assessed the need for Labs I Assessed the need for Foley I Assessed the need for Central Venous Line Family Discussion when available I Assessed the need for Mobilization I made an Assessment of medications to be adjusted accordingly Safety Risk assessment Completed  CASE DISCUSSED IN MULTIDISCIPLINARY ROUNDS WITH ICU TEAM   Critical Care Time devoted to patient care services described in this note is 75 minutes.   Overall, patient is critically ill, prognosis is guarded.  Patient with Multiorgan failure and at high risk for cardiac arrest and death.   Transfer to [facility] for further management  Lucie Leather, M.D.  [facility]  Medical Director [facility] Medical Director [facility] Cardio-Pulmonary Department

## 2020-02-01 NOTE — ED Notes (Signed)
02-06-2020  Test: troponin  Critical Value: 180  Name of Provider Notified: MD Darnelle Catalan

## 2020-02-01 NOTE — Progress Notes (Signed)
CH recieved CODE STEMI and phone page from ED; pt. brought to ED post-cardiac arrest; pt. unidentified at time of [facility] visit and no family or support persons present; CH checked in periodically for approx while medical team attended to pt. but was then called to meeting; ED staff aware chaplains are avilable as needed via page if needed.

## 2020-02-01 NOTE — ED Notes (Signed)
[facility] at bedside 

## 2020-02-01 NOTE — ED Notes (Signed)
Called Respiratory to check and readjust tubing.

## 2020-02-01 NOTE — Progress Notes (Signed)
   02/19/2020 2011  Clinical Encounter Type  Visited With Patient  Visit Type Code  Referral From Nurse  Consult/Referral To Chaplain   Chaplain responded to Code Cool. No family or support person present. No current need for chaplain. Chaplain remains available as needed.  This note was prepared by Chaplain Resident, Tacy Learn, MDiv. Chaplain remains available as needed through the on-call pager: [phone].

## 2020-02-01 NOTE — ED Notes (Signed)
[facility] en route.  °

## 2020-02-01 NOTE — Plan of Care (Signed)
  Problem: Education: Goal: Ability to manage disease process will improve Outcome: Not Progressing   Problem: Cardiac: Goal: Ability to achieve and maintain adequate cardiopulmonary perfusion will improve Outcome: Progressing   Problem: Neurologic: Goal: Promote progressive neurologic recovery Outcome: Progressing   Problem: Skin Integrity: Goal: Risk for impaired skin integrity will be minimized. Outcome: Progressing

## 2020-02-01 NOTE — ED Notes (Signed)
Spoke with [facility] - Bed request

## 2020-02-01 NOTE — Progress Notes (Signed)
PHARMACY -  BRIEF ANTIBIOTIC NOTE   Pharmacy has received consult(s) for an unresponsive pt, following seizure x 2 then cardiac arrest, from an ED provider.  The patient's profile has been reviewed for ht/wt/allergies/indication/available labs.    One time order(s) placed for Cefepime, Vanc, and Flagyl.  Further antibiotics/pharmacy consults should be ordered by admitting physician if indicated.                       Thank you, Otelia Sergeant, PharmD, [facility] 22-Feb-2020 1:56 PM
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
## 2020-02-01 NOTE — Progress Notes (Signed)
eLink Physician-Brief Progress Note Patient Name: Angelica Stevens DOB: December 05, 1960 MRN: [id number]   Date of Service  02/19/20  HPI/Events of Note  Hypokalemia - K+ = 2.2. Patient given KCl 80 meq per tube at 9 PM.  eICU Interventions  Plan: 1. Repeat BMP at 2 AM.      Intervention Category Major Interventions: Electrolyte abnormality - evaluation and management  Abdulrahim Siddiqi Eugene 2020/02/19, 10:06 PM

## 2020-02-01 NOTE — Progress Notes (Signed)
Pt was transported to CT and back to the ED while on the vent.
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
## 2020-02-02 ENCOUNTER — Encounter (HOSPITAL_COMMUNITY): Payer: Self-pay | Admitting: Pulmonary Disease

## 2020-02-02 ENCOUNTER — Inpatient Hospital Stay (HOSPITAL_COMMUNITY): Payer: Medicaid Other

## 2020-02-02 ENCOUNTER — Other Ambulatory Visit: Payer: Self-pay

## 2020-02-02 DIAGNOSIS — I469 Cardiac arrest, cause unspecified: Secondary | ICD-10-CM

## 2020-02-02 DIAGNOSIS — G253 Myoclonus: Secondary | ICD-10-CM

## 2020-02-02 DIAGNOSIS — Z9911 Dependence on respirator [ventilator] status: Secondary | ICD-10-CM

## 2020-02-02 DIAGNOSIS — N179 Acute kidney failure, unspecified: Secondary | ICD-10-CM

## 2020-02-02 DIAGNOSIS — J9601 Acute respiratory failure with hypoxia: Secondary | ICD-10-CM

## 2020-02-02 DIAGNOSIS — G40909 Epilepsy, unspecified, not intractable, without status epilepticus: Secondary | ICD-10-CM

## 2020-02-02 DIAGNOSIS — G9349 Other encephalopathy: Secondary | ICD-10-CM

## 2020-02-02 LAB — PROTIME-INR
INR: 1.2 (ref 0.8–1.2)
Prothrombin Time: 14.3 seconds (ref 11.4–15.2)

## 2020-02-02 LAB — ECHOCARDIOGRAM COMPLETE
AV Vena cont: 0.49 cm
Area-P 1/2: 2.99 cm2
Height: 57.48 in
P 1/2 time: 641 msec
S' Lateral: 2.9 cm
Weight: 2342.17 oz

## 2020-02-02 LAB — POCT I-STAT 7, (LYTES, BLD GAS, ICA,H+H)
Acid-base deficit: 4 mmol/L — ABNORMAL HIGH (ref 0.0–2.0)
Acid-base deficit: 6 mmol/L — ABNORMAL HIGH (ref 0.0–2.0)
Bicarbonate: 20.3 mmol/L (ref 20.0–28.0)
Bicarbonate: 19.5 mmol/L — ABNORMAL LOW (ref 20.0–28.0)
Calcium, Ion: 1.11 mmol/L — ABNORMAL LOW (ref 1.15–1.40)
Calcium, Ion: 1.16 mmol/L (ref 1.15–1.40)
HCT: 32 % — ABNORMAL LOW (ref 36.0–46.0)
HCT: 31 % — ABNORMAL LOW (ref 36.0–46.0)
Hemoglobin: 10.9 g/dL — ABNORMAL LOW (ref 12.0–15.0)
Hemoglobin: 10.5 g/dL — ABNORMAL LOW (ref 12.0–15.0)
O2 Saturation: 93 %
O2 Saturation: 99 %
Patient temperature: 36.3
Patient temperature: 35.5
Potassium: 3.6 mmol/L (ref 3.5–5.1)
Potassium: 3.3 mmol/L — ABNORMAL LOW (ref 3.5–5.1)
Sodium: 143 mmol/L (ref 135–145)
Sodium: 141 mmol/L (ref 135–145)
TCO2: 21 mmol/L — ABNORMAL LOW (ref 22–32)
TCO2: 21 mmol/L — ABNORMAL LOW (ref 22–32)
pCO2 arterial: 34.1 mmHg (ref 32.0–48.0)
pCO2 arterial: 34.3 mmHg (ref 32.0–48.0)
pH, Arterial: 7.379 (ref 7.350–7.450)
pH, Arterial: 7.355 (ref 7.350–7.450)
pO2, Arterial: 67 mmHg — ABNORMAL LOW (ref 83.0–108.0)
pO2, Arterial: 123 mmHg — ABNORMAL HIGH (ref 83.0–108.0)

## 2020-02-02 LAB — PHOSPHORUS: Phosphorus: 3 mg/dL (ref 2.5–4.6)

## 2020-02-02 LAB — BASIC METABOLIC PANEL
Anion gap: 14 (ref 5–15)
Anion gap: 14 (ref 5–15)
BUN: 22 mg/dL — ABNORMAL HIGH (ref 6–20)
BUN: 22 mg/dL — ABNORMAL HIGH (ref 6–20)
CO2: 19 mmol/L — ABNORMAL LOW (ref 22–32)
CO2: 19 mmol/L — ABNORMAL LOW (ref 22–32)
Calcium: 8.3 mg/dL — ABNORMAL LOW (ref 8.9–10.3)
Calcium: 7.8 mg/dL — ABNORMAL LOW (ref 8.9–10.3)
Chloride: 106 mmol/L (ref 98–111)
Chloride: 108 mmol/L (ref 98–111)
Creatinine, Ser: 2.1 mg/dL — ABNORMAL HIGH (ref 0.44–1.00)
Creatinine, Ser: 2.21 mg/dL — ABNORMAL HIGH (ref 0.44–1.00)
GFR, Estimated: 27 mL/min — ABNORMAL LOW (ref >60)
GFR, Estimated: 25 mL/min — ABNORMAL LOW (ref >60)
Glucose, Bld: 121 mg/dL — ABNORMAL HIGH (ref 70–99)
Glucose, Bld: 116 mg/dL — ABNORMAL HIGH (ref 70–99)
Potassium: 3 mmol/L — ABNORMAL LOW (ref 3.5–5.1)
Potassium: 3.3 mmol/L — ABNORMAL LOW (ref 3.5–5.1)
Sodium: 139 mmol/L (ref 135–145)
Sodium: 141 mmol/L (ref 135–145)

## 2020-02-02 LAB — MAGNESIUM: Magnesium: 1.4 mg/dL — ABNORMAL LOW (ref 1.7–2.4)

## 2020-02-02 LAB — CBC
HCT: 33.3 % — ABNORMAL LOW (ref 36.0–46.0)
Hemoglobin: 11.5 g/dL — ABNORMAL LOW (ref 12.0–15.0)
MCH: 33.5 pg (ref 26.0–34.0)
MCHC: 34.5 g/dL (ref 30.0–36.0)
MCV: 97.1 fL (ref 80.0–100.0)
Platelets: 281 10*3/uL (ref 150–400)
RBC: 3.43 MIL/uL — ABNORMAL LOW (ref 3.87–5.11)
RDW: 14.3 % (ref 11.5–15.5)
WBC: 23.4 10*3/uL — ABNORMAL HIGH (ref 4.0–10.5)
nRBC: 0 % (ref 0.0–0.2)

## 2020-02-02 LAB — GLUCOSE, CAPILLARY
Glucose-Capillary: 106 mg/dL — ABNORMAL HIGH (ref 70–99)
Glucose-Capillary: 113 mg/dL — ABNORMAL HIGH (ref 70–99)
Glucose-Capillary: 70 mg/dL (ref 70–99)
Glucose-Capillary: 79 mg/dL (ref 70–99)
Glucose-Capillary: 83 mg/dL (ref 70–99)
Glucose-Capillary: 92 mg/dL (ref 70–99)

## 2020-02-02 LAB — APTT: aPTT: 34 seconds (ref 24–36)

## 2020-02-02 MED ORDER — VITAL HIGH PROTEIN PO LIQD
1000.0000 mL | ORAL | Status: DC
  Administered 2020-02-02 – 2020-02-04 (×3): 1000 mL

## 2020-02-02 MED ORDER — INSULIN ASPART 100 UNIT/ML ~~LOC~~ SOLN: 1.0000 [IU] | SUBCUTANEOUS | Status: DC

## 2020-02-02 MED ORDER — LEVETIRACETAM IN NACL 1500 MG/100ML IV SOLN
1500.0000 mg | Freq: Once | INTRAVENOUS | Status: AC
  Administered 2020-02-02: 1500 mg via INTRAVENOUS
  Filled 2020-02-02: qty 100

## 2020-02-02 MED ORDER — LEVETIRACETAM IN NACL 1000 MG/100ML IV SOLN
1000.0000 mg | Freq: Two times a day (BID) | INTRAVENOUS | Status: DC
  Administered 2020-02-02 – 2020-02-04 (×4): 1000 mg via INTRAVENOUS
  Filled 2020-02-02 (×4): qty 100

## 2020-02-02 MED ORDER — MIDAZOLAM HCL 2 MG/2ML IJ SOLN
2.0000 mg | INTRAMUSCULAR | Status: DC | PRN
  Administered 2020-02-02 – 2020-02-03 (×4): 2 mg via INTRAVENOUS
  Filled 2020-02-02 (×4): qty 2

## 2020-02-02 MED ORDER — PROSOURCE TF PO LIQD
45.0000 mL | Freq: Two times a day (BID) | ORAL | Status: DC
  Administered 2020-02-02: 45 mL
  Filled 2020-02-02: qty 45

## 2020-02-02 MED ORDER — POTASSIUM CHLORIDE 20 MEQ/15ML (10%) PO SOLN
30.0000 meq | Freq: Once | ORAL | Status: AC
  Administered 2020-02-02: 30 meq
  Filled 2020-02-02: qty 30

## 2020-02-02 MED ORDER — SODIUM CHLORIDE 0.9 % IV SOLN
INTRAVENOUS | Status: DC | PRN
  Administered 2020-02-02: 1000 mL via INTRAVENOUS

## 2020-02-02 MED ORDER — MAGNESIUM SULFATE 2 GM/50ML IV SOLN
2.0000 g | Freq: Once | INTRAVENOUS | Status: AC
  Administered 2020-02-02: 2 g via INTRAVENOUS
  Filled 2020-02-02: qty 50

## 2020-02-02 MED ORDER — SODIUM CHLORIDE 0.9 % IV BOLUS
1000.0000 mL | Freq: Once | INTRAVENOUS | Status: AC
  Administered 2020-02-02: 1000 mL via INTRAVENOUS

## 2020-02-02 MED ORDER — VITAL HIGH PROTEIN PO LIQD
1000.0000 mL | ORAL | Status: DC
  Administered 2020-02-02: 1000 mL

## 2020-02-02 MED ORDER — POTASSIUM CHLORIDE 20 MEQ PO PACK
40.0000 meq | PACK | Freq: Once | ORAL | Status: AC
  Administered 2020-02-02: 40 meq
  Filled 2020-02-02: qty 2

## 2020-02-02 MED ORDER — PROSOURCE TF PO LIQD
45.0000 mL | Freq: Four times a day (QID) | ORAL | Status: DC
  Administered 2020-02-02 – 2020-02-05 (×11): 45 mL
  Filled 2020-02-02 (×11): qty 45

## 2020-02-02 MED ORDER — SODIUM CHLORIDE 0.9 % IV SOLN
3.0000 g | Freq: Two times a day (BID) | INTRAVENOUS | Status: DC
  Administered 2020-02-02 – 2020-02-05 (×7): 3 g via INTRAVENOUS
  Filled 2020-02-02 (×6): qty 3
  Filled 2020-02-02 (×2): qty 8

## 2020-02-02 NOTE — Progress Notes (Addendum)
Pt has remained dyssynchronous with the vent since my arrival tonight. Coordinated with Elink MD to optimize sedation for patient to be more compliant with vent. Dr. Katrinka Blazing on floor and at bedside- made changes to vent and sedation. RT at bedside. See MAR for dose changes in sedation. Orders received for ABG at 0015.   Results reported to Dr. Katrinka Blazing and MD came back to bedside to reassess. Pt began having myoclonic jerking activity requiring increases in propofol infusion, also with spiking temp on TTM requiring low water temps to maintain a slow rewarm- as low at 8 degrees celsius. Decision was made to paralyze patient. See orders that were placed by MD.   BIS applied- goal 40-60. TOF baseline 4/4 twitches on 25 mAs.

## 2020-02-02 NOTE — Progress Notes (Signed)
Grey colored ring removed from patients left hand- placed in container with patient label, sealed with pink tape, and placed in patient belongings bag. Will relay to dayshift to send belongings home with daughter tomorrow when she visits.

## 2020-02-02 NOTE — Progress Notes (Signed)
eLink Physician-Brief Progress Note Patient Name: Janise Gora DOB: 01/28/1961 MRN: [id number]   Date of Service  02/02/2020  HPI/Events of Note  Hypokalemia / Hypomagnesemia - K+ = 3.3, Mg++ = 1.4 and Creatinine = 2.21.   eICU Interventions  Will replace Mg++ and K+.      Intervention Category Major Interventions: Electrolyte abnormality - evaluation and management  Nhat Hearne Eugene 02/02/2020, 6:53 AM

## 2020-02-02 NOTE — Progress Notes (Signed)
eLink Physician-Brief Progress Note Patient Name: Angelica Stevens DOB: 03/12/1961 MRN: [id number]   Date of Service  02/02/2020  HPI/Events of Note  Multiple issues: 1. Oluguria - Badder scan reveals no residual. CVP = 3.5 and Hypokalemia - K+ = 3.0.   eICU Interventions  Plan: 1. Replace K+. 2. Bolus with 0.9 NaCl 1 liter IV over 1 hour now.      Intervention Category Major Interventions: Other:;Electrolyte abnormality - evaluation and management  Larnie Heart Dennard Nip 02/02/2020, 2:58 AM

## 2020-02-02 NOTE — Progress Notes (Signed)
Daughter at beside, was given pt keys and ring to take home.

## 2020-02-02 NOTE — Progress Notes (Signed)
Rewarming initiated per 36 degree TTM protocol. Arctic sun temp control set for 37 degrees at rewarm rate of 0.25 degree celsius/hour.

## 2020-02-02 NOTE — Progress Notes (Signed)
  Echocardiogram 2D Echocardiogram has been performed.  Tye Savoy 02/02/2020, 10:05 AM

## 2020-02-02 NOTE — Progress Notes (Signed)
eLink Physician-Brief Progress Note Patient Name: Angelica Stevens DOB: 15-Dec-1960 MRN: [id number]   Date of Service  02/02/2020  HPI/Events of Note  Multiple issues: 1. Fever - Trying to rewarm. Request for Tylenol. AST in elevated, therefore, no Tylenol. Creatinine = 2.21, therefore, no NSAID. 2. Ventilator asynchrony.  eICU Interventions  Plan: 1. Ice packs PRN. 2. Versed 2 mg IV Q 1 hour PRN.      Intervention Category Major Interventions: Other:  Lenell Antu 02/02/2020, 8:33 PM

## 2020-02-02 NOTE — Progress Notes (Addendum)
Initial Nutrition Assessment  DOCUMENTATION CODES:   Not applicable  INTERVENTION:  Once pt is no longer on [employer] Protocol and has been fully rewarmed, recommend initiating  -Vital High Protein @ 25 ml/hr (600 ml/day) with 45 ml ProSource via OG tube 4 times daily  Regimen provides 760 kcal (1425 total kcal with propofol at current rate), 97 grams of protein and 504 ml free water   Addendum: Discussed pt with RDIII. Pt on normothermia (target 36 C). Pt does not require paralytics and is safe to start tube feeds.  NUTRITION DIAGNOSIS:   Inadequate oral intake related to inability to eat as evidenced by NPO status.  GOAL:   Provide needs based on ASPEN/SCCM guidelines    MONITOR:   Labs, Vent status, Weight trends, TF tolerance, I & O's, Other (Comment) (temperature)  REASON FOR ASSESSMENT:   Ventilator, Consult Enteral/tube feeding initiation and management  ASSESSMENT:  RD working remotely.   59 year old woman with history of HTN and seizures presents after witnessed seizure and PEA arrest.  11/2- transfer [facility] from [facility] 11/2- Arctic Sun Protocol initiated   Initiation of tube feedings are not recommended until patient has been fully rewarmed from hypothermia protocol. Will continue to monitor, once appropriate recommend starting tube feeds with regimen provided above.  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 No seizures seen. -cardiac cath deferred  Admit wt 63.5 kg Current wt 66.4 kg No weight history for review, will use admit wt for calculating needs.  BP: 146/135 (cuff) MAP: 185 (cuff)   I/O: +1972.2 ml since admit UOP: 135 ml x 24 hrs  Patient is currently sedated and intubated on ventilator support MV: 14.1 L/min Temp (24hrs), Avg:98.4 F (36.9 C), Min:95.4 F (35.2 C),  Max:103 F (39.4 C)  Propofol: 25.2 ml/hr provides 665 kcal  Medications reviewed and include: SSI, Dilantin Drips: Unasyn Keppra Fentanyl @ 10 ml/hr Levo- stopped 0715  LR @ 75 ml/hr  Labs: CBGs 79,113,116,121 x 24 hrs, BUN 22 (H), Cr 2.21 (H), Mg 1.4 (L), P 3.0 (WNL), K 3.6 (WNL)  NUTRITION - FOCUSED PHYSICAL EXAM: Unable to complete at this time, RD working remotely.   Diet Order:   Diet Order    None      EDUCATION NEEDS:   No education needs have been identified at this time  Skin:  Skin Assessment: Reviewed RN Assessment  Last BM:  11/2  Height:   Ht Readings from Last 1 Encounters:  02-15-20 4' 9.48" (1.46 m)    Weight:   Wt Readings from Last 1 Encounters:  02/02/20 66.4 kg    Ideal Body Weight:  43.5 kg  BMI:  Body mass index is 31.15 kg/m.  Estimated Nutritional Needs:   Kcal:  1295  Protein:  95-108  Fluid:  >/= 1.2 L   Lars Masson, RD, LDN Clinical Nutrition After Hours/Weekend Pager # in Amion

## 2020-02-02 NOTE — Procedures (Addendum)
Patient Name: Angelica Stevens  MRN: [id number]  Epilepsy Attending: Charlsie Quest  Referring Physician/Provider: Rutherford Guys, PA Date: 2020-02-12 Duration: 24.04 minutes  Patient history: 59 year old female status post cardiac arrest. EEG to evaluate for seizures.  Level of alertness: Comatose  AEDs during EEG study: Phenytoin, Keppra, propofol  Technical aspects: This EEG study was done with scalp electrodes positioned according to the 10-20 International system of electrode placement. Electrical activity was acquired at a sampling rate of 500Hz  and reviewed with a high frequency filter of 70Hz  and a low frequency filter of 1Hz . EEG data were recorded continuously and digitally stored.   Description: EEG showed near continuous generalized background suppression. Brief (<0.5s) intermittent polymorphic 3 to 5 Hz theta-delta slowing admixed with generalized polyspikes were also noted. Hyperventilation and photic stimulation were not performed.     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 No seizures were seen throughout the recording.   [name] 

## 2020-02-02 NOTE — Progress Notes (Signed)
NAME:  Angelica Stevens, MRN:  [id number], DOB:  Jun 18, 1960, LOS: 1 ADMISSION DATE:  Feb 16, 2020, CONSULTATION DATE:  16-Feb-2020 REFERRING MD:  Belia Heman, CHIEF COMPLAINT:  seizure   Brief History   59 year old woman admitted to [facility] after PEA arrest.  Transferred to [facility] for further management.  History of present illness   59 year old woman presenting with witnessed seizure activity followed by unresponsiveness.  Unclear downtime.  First responders found wide complex rhythm which they shocked.  Reported 5 minutes downtime but records are not that great.  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  UDS positive for cocaine.  Pt transferred to [facility] for further evaluation and management.  Past Medical History  Unknown  Significant Hospital Events   11/2 Transfer to [facility] from [facility]  Consults:  Neurology  Procedures:  11/2 ETT Significant Diagnostic Tests:  CT Head>> age indeterminate left lacunar infarct  Micro Data:  11/2 Covid>>neg at OSH 11/3 Respiratory culture>>  Antimicrobials:  None.   Interim history/subjective:  Hypotension improved, EEG with near continuous background suppression and poly spikes  Objective   Blood pressure 107/76, pulse 66, temperature (!) 96.4 F (35.8 C), temperature source Bladder, resp. rate (!) 24, height 4' 9.48" (1.46 m), weight 66.4 kg, SpO2 100 %. CVP:  [2 mmHg-15 mmHg] 9 mmHg  Vent Mode: PRVC FiO2 (%):  [40 %-100 %] 40 % Set Rate:  [16 bmp-29 bmp] 24 bmp Vt Set:  [420 mL-450 mL] 450 mL PEEP:  [5 cmH20] 5 cmH20 Plateau Pressure:  [19 cmH20-21 cmH20] 21 cmH20   Intake/Output Summary (Last 24 hours) at 02/02/2020 0820 Last data filed at 02/02/2020 0700 Gross per 24 hour  Intake 2457.18 ml  Output 485 ml  Net 1972.18 ml   Filed Weights   2020/02/16 1930 02/02/20 0500  Weight: 63.5 kg 66.4 kg    Examination:  General: Adult female, well nourished, unresponsive Neuro: examined on propofol, does not withdraw to pain, +corneal on the  R, pupils 4mm and unresponsive, +gag, not breathing over the vent HEENT: Sclerae anicteric. ETT in place. Cardiovascular: RRR, no M/R/G.  Lungs: Respirations even and unlabored.  No rhonchi or wheezing Abdomen: BS x 4, soft, NT/ND.  Musculoskeletal: No gross deformities, no edema.  Skin: Intact, warm, no rashes.   Assessment & Plan:   Unwitnessed out of hospital Cardiac arrest   unclear etiology but UDS + for cocaine. Pressors off, vitals stable but poor neurologic prognosis P: -continue TTM, goal 36 degrees. - Levophed PRN, goal MAP > 70. - Assess CVP's -Echo pending - Trend troponin, lactate. - Hold preadmission zestoretic, lopressor. 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  CXR with possible aspiration P: -Maintain full vent support with SAT/SBT as tolerated -titrate Vent setting to maintain SpO2 greater than or equal to 90%. -HOB elevated 30 degrees. -Plateau pressures less than 30 cm H20.  -Follow chest x-ray, ABG prn.   -Bronchial hygiene and RT/bronchodilator protocol. -Unasynx 5d and tracheal aspirate culture    Hx seizures. Epileptogenicity on EEG with myoclonus when propofol weaned, Dilantin level low P -Load with Keppra and continue dilantin    AKI, Hypokalemia, hypomagnesemia At risk for multiple metabolic derangements during cooling. P: -replete as needed and follow, creatinine 2.1 with 0.66ml/kg/hr OUP  Hyperglycemia -SSI   Best practice:  Diet: NPO.  Start  TF Pain/Anxiety/Delirium protocol (if indicated): Propofol gtt / Fentanyl gtt.  RASS goal -1. VAP protocol (if indicated): In place. DVT prophylaxis: SCD's / heparin. GI prophylaxis: PPI. Glucose control: Insulin gtt. Mobility: Bedrest. Code Status: Full. Family Communication: None  available. Disposition: ICU.   Labs   CBC: Recent Labs  Lab 2020/02/24 1150 February 24, 2020 2113 02/02/20 0455 02/02/20 0520  WBC 21.5*  --  23.4*  --   NEUTROABS 10.4*  --   --   --   HGB 13.0 13.3 11.5* 10.9*  HCT 39.0 39.0 33.3* 32.0*  MCV 100.0  --  97.1  --   PLT 382  --  281  --     Basic Metabolic Panel: Recent Labs  Lab 2020/02/24 1150 02/24/20 1150 02/24/2020 2045 02/24/20 2113 02/02/20 0108 02/02/20 0455 02/02/20 0520  NA 135   < > 138 141 139 141 143  K 3.0*   < > 2.2* 2.2* 3.0* 3.3* 3.6  CL 98  --  102  --  106 108  --   CO2 15*  --  22  --  19* 19*  --   GLUCOSE 348*  --  115*  --  121* 116*  --   BUN 11  --  21*  --  22* 22*  --   CREATININE 1.23*  --  1.92*  --  2.10* 2.21*  --   CALCIUM 8.5*  --  8.3*  --  8.3* 7.8*  --   MG  --   --   --   --   --  1.4*  --   PHOS  --   --   --   --   --  3.0  --    < > = values in this interval not displayed.   GFR: Estimated Creatinine Clearance: 22.1 mL/min (A) (by C-G formula based on SCr of 2.21 mg/dL (H)). Recent Labs  Lab Feb 24, 2020 1150 02/02/20 0455  WBC 21.5* 23.4*    Liver Function Tests: Recent Labs  Lab Feb 24, 2020 1150  AST 76*  ALT 20  ALKPHOS 95  BILITOT 1.2  PROT 8.0  ALBUMIN 3.7   No results for input(s): LIPASE, AMYLASE in the last 168 hours. No results for input(s): AMMONIA in the last 168 hours.  ABG    Component Value Date/Time   PHART 7.379 02/02/2020 0520   PCO2ART 34.1 02/02/2020 0520   PO2ART 67 (L) 02/02/2020 0520   HCO3 20.3 02/02/2020 0520   TCO2 21 (L) 02/02/2020 0520   ACIDBASEDEF 4.0 (H) 02/02/2020 0520   O2SAT 93.0 02/02/2020 0520     Coagulation Profile: Recent Labs  Lab 02-24-20 2045 02/02/20 0455  INR 1.1 1.2    Cardiac Enzymes: No results for input(s): CKTOTAL, CKMB, CKMBINDEX, TROPONINI in the last 168 hours.  HbA1C: No results found for: HGBA1C  CBG: Recent Labs  Lab 02/24/20 2017 2020/02/24 2149 02-24-2020 2311 02/02/20 0455 02/02/20 0753  GLUCAP 121*  121* 116* 113* 79    Review of Systems:   Unable to obtain as pt is encephalopathic.  Past Medical History  She,  has no past medical history on file.   Surgical History   Not available.  Social History      Family History   Her family history is not on file.   Allergies Not on File   Home Medications  Prior to Admission medications   Not on File     Critical care time: 45 min.   Rutherford Guys,  PA - [name] 02/02/2020, 8:20 AM

## 2020-02-02 NOTE — Procedures (Addendum)
Patient Name: Angelica Stevens  MRN: [id number]  Epilepsy Attending: Charlsie Quest  Referring Physician/Provider: Rutherford Guys, PA Duration: February 25, 2020 2226 to 02/02/2020 2226    Patient history: 59 year old female status post cardiac arrest. EEG to evaluate for seizures.  Level of alertness: Comatose  AEDs during EEG study: Phenytoin, Keppra, propofol  Technical aspects: This EEG study was done with scalp electrodes positioned according to the 10-20 International system of electrode placement. Electrical activity was acquired at a sampling rate of 500Hz  and reviewed with a high frequency filter of 70Hz  and a low frequency filter of 1Hz . EEG data were recorded continuously and digitally stored.   Description: EEG initially showed burst suppression pattern with bursts of generalized polyspikes lasting about 1 to 2 seconds every 30-60 seconds on an average.  After around 9am on 02/02/2020, bursts became more frequent happening every 3 to 5 seconds.  Gradually the became more frequent with generalized periodic epileptiform discharges with overriding fast activity at 0.25 to 1 Hz.  Of note, per critical care team patient was noted to have intermittent jerking of bilateral upper extremities is difficult to visualize on video.  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   Of note, per critical care team patient was noted to have intermittent jerking of bilateral upper extremity which is difficult to visualize on video but are likely myoclonic seizures given history and EEG findings.  Angelica Stevens 

## 2020-02-02 NOTE — Progress Notes (Signed)
eLink Physician-Brief Progress Note Patient Name: Angelica Stevens DOB: July 01, 1960 MRN: [id number]   Date of Service  02/02/2020  HPI/Events of Note  Ventilator asynchrony persists - Currently on Propofol IV infusion at 80 mcg/kg/min, Fentanyl IV infusion at 300 mcg/hour and Versed 2 mg Q 1 hour PRN  eICU Interventions  Plan: 1. Increase Fentanyl IV infusion ceiling to 400 mcg/min.     Intervention Category Major Interventions: Respiratory failure - evaluation and management  Dennisha Mouser Eugene 02/02/2020, 10:47 PM

## 2020-02-02 NOTE — Progress Notes (Signed)
All critical labs and low results reported to [facility] and orders placed by MD as indicated.

## 2020-02-03 DIAGNOSIS — J9602 Acute respiratory failure with hypercapnia: Secondary | ICD-10-CM

## 2020-02-03 LAB — POCT I-STAT 7, (LYTES, BLD GAS, ICA,H+H)
Acid-base deficit: 7 mmol/L — ABNORMAL HIGH (ref 0.0–2.0)
Acid-base deficit: 7 mmol/L — ABNORMAL HIGH (ref 0.0–2.0)
Acid-base deficit: 7 mmol/L — ABNORMAL HIGH (ref 0.0–2.0)
Acid-base deficit: 8 mmol/L — ABNORMAL HIGH (ref 0.0–2.0)
Bicarbonate: 19.6 mmol/L — ABNORMAL LOW (ref 20.0–28.0)
Bicarbonate: 19.6 mmol/L — ABNORMAL LOW (ref 20.0–28.0)
Bicarbonate: 20.5 mmol/L (ref 20.0–28.0)
Bicarbonate: 20.7 mmol/L (ref 20.0–28.0)
Calcium, Ion: 1.18 mmol/L (ref 1.15–1.40)
Calcium, Ion: 1.2 mmol/L (ref 1.15–1.40)
Calcium, Ion: 1.21 mmol/L (ref 1.15–1.40)
Calcium, Ion: 1.22 mmol/L (ref 1.15–1.40)
HCT: 27 % — ABNORMAL LOW (ref 36.0–46.0)
HCT: 29 % — ABNORMAL LOW (ref 36.0–46.0)
HCT: 30 % — ABNORMAL LOW (ref 36.0–46.0)
HCT: 32 % — ABNORMAL LOW (ref 36.0–46.0)
Hemoglobin: 10.2 g/dL — ABNORMAL LOW (ref 12.0–15.0)
Hemoglobin: 10.9 g/dL — ABNORMAL LOW (ref 12.0–15.0)
Hemoglobin: 9.2 g/dL — ABNORMAL LOW (ref 12.0–15.0)
Hemoglobin: 9.9 g/dL — ABNORMAL LOW (ref 12.0–15.0)
O2 Saturation: 90 %
O2 Saturation: 92 %
O2 Saturation: 94 %
O2 Saturation: 94 %
Patient temperature: 36.2
Patient temperature: 36.8
Patient temperature: 37
Patient temperature: 37
Potassium: 3.3 mmol/L — ABNORMAL LOW (ref 3.5–5.1)
Potassium: 3.7 mmol/L (ref 3.5–5.1)
Potassium: 3.8 mmol/L (ref 3.5–5.1)
Potassium: 4 mmol/L (ref 3.5–5.1)
Sodium: 143 mmol/L (ref 135–145)
Sodium: 143 mmol/L (ref 135–145)
Sodium: 143 mmol/L (ref 135–145)
Sodium: 143 mmol/L (ref 135–145)
TCO2: 21 mmol/L — ABNORMAL LOW (ref 22–32)
TCO2: 21 mmol/L — ABNORMAL LOW (ref 22–32)
TCO2: 22 mmol/L (ref 22–32)
TCO2: 22 mmol/L (ref 22–32)
pCO2 arterial: 44 mmHg (ref 32.0–48.0)
pCO2 arterial: 45.6 mmHg (ref 32.0–48.0)
pCO2 arterial: 51.4 mmHg — ABNORMAL HIGH (ref 32.0–48.0)
pCO2 arterial: 51.6 mmHg — ABNORMAL HIGH (ref 32.0–48.0)
pH, Arterial: 7.208 — ABNORMAL LOW (ref 7.350–7.450)
pH, Arterial: 7.211 — ABNORMAL LOW (ref 7.350–7.450)
pH, Arterial: 7.242 — ABNORMAL LOW (ref 7.350–7.450)
pH, Arterial: 7.253 — ABNORMAL LOW (ref 7.350–7.450)
pO2, Arterial: 73 mmHg — ABNORMAL LOW (ref 83.0–108.0)
pO2, Arterial: 77 mmHg — ABNORMAL LOW (ref 83.0–108.0)
pO2, Arterial: 80 mmHg — ABNORMAL LOW (ref 83.0–108.0)
pO2, Arterial: 81 mmHg — ABNORMAL LOW (ref 83.0–108.0)

## 2020-02-03 LAB — GLUCOSE, CAPILLARY
Glucose-Capillary: 82 mg/dL (ref 70–99)
Glucose-Capillary: 79 mg/dL (ref 70–99)
Glucose-Capillary: 78 mg/dL (ref 70–99)
Glucose-Capillary: 86 mg/dL (ref 70–99)
Glucose-Capillary: 71 mg/dL (ref 70–99)

## 2020-02-03 LAB — CBC
HCT: 30.5 % — ABNORMAL LOW (ref 36.0–46.0)
Hemoglobin: 10.3 g/dL — ABNORMAL LOW (ref 12.0–15.0)
MCH: 34.4 pg — ABNORMAL HIGH (ref 26.0–34.0)
MCHC: 33.8 g/dL (ref 30.0–36.0)
MCV: 102 fL — ABNORMAL HIGH (ref 80.0–100.0)
Platelets: 266 10*3/uL (ref 150–400)
RBC: 2.99 MIL/uL — ABNORMAL LOW (ref 3.87–5.11)
RDW: 14.6 % (ref 11.5–15.5)
WBC: 23.4 10*3/uL — ABNORMAL HIGH (ref 4.0–10.5)
nRBC: 0 % (ref 0.0–0.2)

## 2020-02-03 LAB — HEMOGLOBIN A1C
Hgb A1c MFr Bld: 4 % — ABNORMAL LOW (ref 4.8–5.6)
Mean Plasma Glucose: 68.1 mg/dL

## 2020-02-03 LAB — PHOSPHORUS: Phosphorus: 6.2 mg/dL — ABNORMAL HIGH (ref 2.5–4.6)

## 2020-02-03 LAB — BASIC METABOLIC PANEL
Anion gap: 12 (ref 5–15)
BUN: 31 mg/dL — ABNORMAL HIGH (ref 6–20)
CO2: 19 mmol/L — ABNORMAL LOW (ref 22–32)
Calcium: 8.3 mg/dL — ABNORMAL LOW (ref 8.9–10.3)
Chloride: 110 mmol/L (ref 98–111)
Creatinine, Ser: 2.74 mg/dL — ABNORMAL HIGH (ref 0.44–1.00)
GFR, Estimated: 19 mL/min — ABNORMAL LOW (ref >60)
Glucose, Bld: 93 mg/dL (ref 70–99)
Potassium: 3.2 mmol/L — ABNORMAL LOW (ref 3.5–5.1)
Sodium: 141 mmol/L (ref 135–145)

## 2020-02-03 LAB — TRIGLYCERIDES: Triglycerides: 370 mg/dL — ABNORMAL HIGH (ref <150)

## 2020-02-03 LAB — MAGNESIUM: Magnesium: 2.4 mg/dL (ref 1.7–2.4)

## 2020-02-03 MED ORDER — VALPROIC ACID 250 MG/5ML PO SOLN
500.0000 mg | Freq: Three times a day (TID) | ORAL | Status: DC
  Administered 2020-02-03 – 2020-02-04 (×2): 500 mg via ORAL
  Filled 2020-02-03 (×2): qty 10

## 2020-02-03 MED ORDER — SODIUM CHLORIDE 0.9 % IV SOLN
0.5000 mg/h | INTRAVENOUS | Status: DC
  Administered 2020-02-04: 70 mg/h via INTRAVENOUS
  Administered 2020-02-04: 30 mg/h via INTRAVENOUS
  Administered 2020-02-04 (×3): 60 mg/h via INTRAVENOUS
  Administered 2020-02-05 (×2): 70 mg/h via INTRAVENOUS
  Filled 2020-02-03: qty 50
  Filled 2020-02-03: qty 100
  Filled 2020-02-03 (×3): qty 50
  Filled 2020-02-03 (×3): qty 100
  Filled 2020-02-03 (×5): qty 50

## 2020-02-03 MED ORDER — MIDAZOLAM HCL 2 MG/2ML IJ SOLN: 2.0000 mg | INTRAMUSCULAR | Status: DC | PRN

## 2020-02-03 MED ORDER — POTASSIUM CHLORIDE 10 MEQ/50ML IV SOLN
10.0000 meq | INTRAVENOUS | Status: AC
  Administered 2020-02-03 (×3): 10 meq via INTRAVENOUS
  Filled 2020-02-03 (×3): qty 50

## 2020-02-03 MED ORDER — MIDAZOLAM HCL 2 MG/2ML IJ SOLN: 5.0000 mg | INTRAMUSCULAR | Status: DC | PRN

## 2020-02-03 MED ORDER — ARTIFICIAL TEARS OPHTHALMIC OINT
1.0000 "application " | TOPICAL_OINTMENT | Freq: Three times a day (TID) | OPHTHALMIC | Status: DC
  Administered 2020-02-03 – 2020-02-05 (×9): 1 via OPHTHALMIC
  Filled 2020-02-03 (×2): qty 3.5

## 2020-02-03 MED ORDER — ROCURONIUM BROMIDE 50 MG/5ML IV SOLN
50.0000 mg | INTRAVENOUS | Status: DC | PRN
  Administered 2020-02-03: 50 mg via INTRAVENOUS
  Filled 2020-02-03 (×2): qty 5

## 2020-02-03 MED ORDER — VALPROATE SODIUM 500 MG/5ML IV SOLN
2500.0000 mg | INTRAVENOUS | Status: AC
  Administered 2020-02-03: 2500 mg via INTRAVENOUS
  Filled 2020-02-03: qty 5

## 2020-02-03 MED ORDER — MIDAZOLAM HCL (PF) 5 MG/ML IJ SOLN: 5.0000 mg | INTRAMUSCULAR | Status: DC | PRN

## 2020-02-03 MED ORDER — INSULIN ASPART 100 UNIT/ML ~~LOC~~ SOLN: 0.0000 [IU] | SUBCUTANEOUS | Status: DC

## 2020-02-03 MED ORDER — NOREPINEPHRINE 16 MG/250ML-% IV SOLN
0.0000 ug/min | INTRAVENOUS | Status: DC
  Administered 2020-02-04: 11 ug/min via INTRAVENOUS
  Filled 2020-02-03 (×2): qty 250

## 2020-02-03 MED ORDER — MIDAZOLAM 50MG/50ML (1MG/ML) PREMIX INFUSION
0.5000 mg/h | INTRAVENOUS | Status: DC
  Administered 2020-02-03: 20 mg/h via INTRAVENOUS
  Administered 2020-02-03: 4 mg/h via INTRAVENOUS
  Administered 2020-02-03 (×3): 20 mg/h via INTRAVENOUS
  Administered 2020-02-03: 10 mg/h via INTRAVENOUS
  Filled 2020-02-03 (×6): qty 50

## 2020-02-03 NOTE — Progress Notes (Signed)
NAME:  Angelica Stevens, MRN:  [id number], DOB:  1960-09-09, LOS: 2 ADMISSION DATE:  02/10/2020, CONSULTATION DATE:  02/12/2020 REFERRING MD:  Belia Heman, CHIEF COMPLAINT:  seizure   Brief History   59 year old woman admitted to [facility] after PEA arrest.  Transferred to [facility] for further management.  History of present illness   59 year old woman presenting with witnessed seizure activity followed by unresponsiveness.  Unclear downtime.  First responders found wide complex rhythm which they shocked.  Reported 5 minutes downtime but records are not that great.  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  UDS positive for cocaine.  Pt transferred to [facility] for further evaluation and management.  Past Medical History  Unknown  Significant Hospital Events   11/2 Transfer to [facility] from [facility]  Consults:  Neurology  Procedures:  11/2 ETT Significant Diagnostic Tests:  CT Head>> LVEF 55-60% mild LVH. Grade I diastolic dysfunction. Normal RV sustolic function. Normal pulm artery systolic pressure. Age indeterminate left lacunar infarct ECHO> Moderate aortic regurg. Moderate AI. Moderate Aortic regurg.   Micro Data:  11/2 Covid>>neg at OSH 11/3 Respiratory culture>>  Antimicrobials:  Unasyn   Interim history/subjective:  Desynchronous with vent overnight, received 1x NMB  Prop dc and started on versed   This morning, EEG with high potential for sz and encephalopathy concerning for anoxic injury   Objective   Blood pressure (!) 124/58, pulse 73, temperature 98.6 F (37 C), temperature source Core, resp. rate 18, height 4' 9.48" (1.46 m), weight 68.4 kg, SpO2 100 %. CVP:  [4 mmHg-29 mmHg] 12 mmHg  Vent Mode: PRVC FiO2 (%):  [30 %] 30 % Set Rate:  [22 bmp-30 bmp] 25 bmp Vt Set:  [320 mL-450 mL] 320 mL PEEP:  [5 cmH20] 5 cmH20 Pressure Support:  [12 cmH20] 12 cmH20 Plateau Pressure:  [15 cmH20] 15 cmH20   Intake/Output Summary (Last 24 hours) at 02/03/2020 0926 Last data filed at  02/03/2020 0600 Gross per 24 hour  Intake 3325.34 ml  Output 1100 ml  Net 2225.34 ml   Filed Weights   02/09/2020 1930 02/02/20 0500 02/03/20 0451  Weight: 63.5 kg 66.4 kg 68.4 kg    Examination: General: Critically ill appearing adult F, intubated sedated on EEG and cooling pads NAD  Neuro: Examined on versed and fentanyl. No response to pain. Pinpoint pupils.  HEENT: NCAT pink mmm, ETT secure + oral airway. Anicteric sclera  Cardiovascular: RRR s1s2 no rgm cap refill < 3 Lungs: Mechanically ventilated. CTA. Symmetrical chest expansion Abdomen: Round. Cooling pads obscure bowel sounds  Musculoskeletal: No obvious joint deformity. No cyanosis or clubbing. SCDs on BLE  Skin: cool, clean dry  Assessment & Plan:   Unwitnessed out of hospital Cardiac arrest   unclear etiology but UDS + for cocaine. Pressors off, vitals stable but poor neurologic prognosis P: - 36 degree post arrest protocol  - Levophed PRN, goal MAP > 70  Suspected anoxic brain injury Hx seizures. Epileptogenicity on EEG with myoclonus when propofol weaned, Dilantin level low Prop changed to Versed due to elevated trigs 11/4 EEG with epileptogenicity with high potential for seizures and diffuse encephalopathy concerning for anoxic injury P -Keppra, Dilatin -cont versed -Neuro to consult, appreciate epileptology input  -suspect poor prognosis  Acute respiratory failure requiring intubation in setting of cardiac arrest CXR with possible aspiration P -Full MV support -Unasyn for empiric aspiration tx -VAP, pulm hygiene   AKI Hypokalemia hypomagnesemia At risk for multiple metabolic derangements during  cooling. P: -trend renal indices and UOP -correct metabolic abnormalities as able  Hyperglycemia -SSI  Leukocytosis -suspect reactive vs aspiration -continue unasyn   Best practice:  Diet: EN Pain/Anxiety/Delirium protocol (if indicated): Versed gtt / Fentanyl gtt.  RASS goal -1. VAP protocol (if  indicated): In place. DVT prophylaxis: SCD's / heparin. GI prophylaxis: PPI. Glucose control: SSI Mobility: Bedrest. Code Status: Full. Family Communication: Pending 11/4 Disposition: ICU.   Labs   CBC: Recent Labs  Lab 02/16/2020 1150 02/23/2020 2113 02/02/20 0455 02/02/20 0520 02/02/20 2213 02/03/20 0015 02/03/20 0403 02/03/20 0407 02/03/20 0821  WBC 21.5*  --  23.4*  --   --   --  23.4*  --   --   NEUTROABS 10.4*  --   --   --   --   --   --   --   --   HGB 13.0   < > 11.5*   < > 10.5* 10.9* 10.3* 10.2* 9.9*  HCT 39.0   < > 33.3*   < > 31.0* 32.0* 30.5* 30.0* 29.0*  MCV 100.0  --  97.1  --   --   --  102.0*  --   --   PLT 382  --  281  --   --   --  266  --   --    < > = values in this interval not displayed.    Basic Metabolic Panel: Recent Labs  Lab 02/20/2020 1150 02/11/2020 1150 02/18/2020 2045 02/16/2020 2113 02/02/20 0108 02/02/20 0108 02/02/20 0455 02/02/20 0520 02/02/20 2213 02/03/20 0015 02/03/20 0403 02/03/20 0407 02/03/20 0821  NA 135   < > 138   < > 139   < > 141   < > 141 143 141 143 143  K 3.0*   < > 2.2*   < > 3.0*   < > 3.3*   < > 3.3* 4.0 3.2* 3.3* 3.7  CL 98  --  102  --  106  --  108  --   --   --  110  --   --   CO2 15*  --  22  --  19*  --  19*  --   --   --  19*  --   --   GLUCOSE 348*  --  115*  --  121*  --  116*  --   --   --  93  --   --   BUN 11  --  21*  --  22*  --  22*  --   --   --  31*  --   --   CREATININE 1.23*  --  1.92*  --  2.10*  --  2.21*  --   --   --  2.74*  --   --   CALCIUM 8.5*  --  8.3*  --  8.3*  --  7.8*  --   --   --  8.3*  --   --   MG  --   --   --   --   --   --  1.4*  --   --   --  2.4  --   --   PHOS  --   --   --   --   --   --  3.0  --   --   --  6.2*  --   --    < > = values in this interval  not displayed.   GFR: Estimated Creatinine Clearance: 18.1 mL/min (A) (by C-G formula based on SCr of 2.74 mg/dL (H)). Recent Labs  Lab 02-21-20 1150 02/02/20 0455 02/03/20 0403  WBC 21.5* 23.4* 23.4*    Liver  Function Tests: Recent Labs  Lab 02/21/2020 1150  AST 76*  ALT 20  ALKPHOS 95  BILITOT 1.2  PROT 8.0  ALBUMIN 3.7   No results for input(s): LIPASE, AMYLASE in the last 168 hours. No results for input(s): AMMONIA in the last 168 hours.  ABG    Component Value Date/Time   PHART 7.242 (L) 02/03/2020 0821   PCO2ART 45.6 02/03/2020 0821   PO2ART 81 (L) 02/03/2020 0821   HCO3 19.6 (L) 02/03/2020 0821   TCO2 21 (L) 02/03/2020 0821   ACIDBASEDEF 7.0 (H) 02/03/2020 0821   O2SAT 94.0 02/03/2020 0821     Coagulation Profile: Recent Labs  Lab 02/21/20 2045 02/02/20 0455  INR 1.1 1.2    CRITICAL CARE Performed by: Lanier Clam   Total critical care time: 38 minutes  Critical care time was exclusive of separately billable procedures and treating other patients. Critical care was necessary to treat or prevent imminent or life-threatening deterioration.  Critical care was time spent personally by me on the following activities: development of treatment plan with patient and/or surrogate as well as nursing, discussions with consultants, evaluation of patient's response to treatment, examination of patient, obtaining history from patient or surrogate, ordering and performing treatments and interventions, ordering and review of laboratory studies, ordering and review of radiographic studies, pulse oximetry and re-evaluation of patient's condition.   Tessie Fass MSN, AGACNP-BC [facility] [facility] [phone] If no answer, [phone] 02/03/2020, 9:26 AM

## 2020-02-03 NOTE — Procedures (Addendum)
Patient Name:Angelica Stevens TLX:[id number] Epilepsy Attending:Colinda Barth Annabelle Harman Referring Physician/Provider:Rahul [name], [place] Duration:02/02/2020 2226 to 02/03/2020 2226    Patient history:59 year old female status post cardiac arrest. EEG to evaluate for seizures.  Level of alertness:Comatose  AEDs during EEG study:Phenytoin, Keppra, versed  Technical aspects: This EEG study was done with scalp electrodes positioned according to the 10-20 International system of electrode placement. Electrical activity was acquired at a sampling rate of 500Hz  and reviewed with a high frequency filter of 70Hz  and a low frequency filter of 1Hz . EEG data were recorded continuously and digitally stored.   Description:EEG  initially showed generalized periodic epileptiform discharges with overriding fast activity which was initiated 0.25 to 1 Hz and gradually progressed to 2 to 2.5 Hz.  There was also generalized EEG suppression in between the epileptiform discharges.  ABNORMALITY -Periodic epileptiform discharges home with overriding fast activity, generalized  IMPRESSION: This studyshowed evidence of generalized epileptogenicity at 2-2.5Hz  which is on the ictal hyper interictal continuum with high potential for seizures.  There is also profound diffuse encephalopathy, nonspecific etiology but likely secondary to anoxic/hypoxic brain injury.   EEG is worsening compared to previous day.  Critical care team notified.

## 2020-02-03 NOTE — Progress Notes (Signed)
[facility]/[place] referral started. Referral number: [id number]. They will follow by phone for now but call for any changes in code status or plans for brain death testing in the future.

## 2020-02-03 NOTE — Progress Notes (Signed)
Increasing Versed gtt to 30 mg/hr. The patient's weight is 68 kg, therefore given the maximum Versed rate for the indication of seizure control being 2 mg/kg/hr, her Versed gtt can go up to a max of 136 mg/hr. Will continue to titrate based on EEG findings.   Electronically signed: Dr. Caryl Pina

## 2020-02-03 NOTE — Progress Notes (Signed)
EEG maint complete. No breakdown at Fp1 Fp2 F3. Continue to monitor

## 2020-02-03 NOTE — Progress Notes (Signed)
PCCM Family Communication Note  Attempted to speak with family at bedside but unfortunately had left. Attempted to reach by phone without success, directed to voicemail. Will continue to attempt to reach family for daily updates as able.    Tessie Fass MSN, AGACNP-BC [place] [facility] 02/03/2020, 3:00 PM

## 2020-02-03 NOTE — Progress Notes (Signed)
eLink Physician-Brief Progress Note Patient Name: Angelica Stevens DOB: 06-18-1960 MRN: [id number]   Date of Service  02/03/2020  HPI/Events of Note  ABG on 30%/PRVC 24/TV 320/P 5 = 7.21/51.4/77/20.7.  eICU Interventions  Plan: 1. Increase PRVC rate to 30. 2. Repeat ABG at 7:30 AM.     Intervention Category Major Interventions: Respiratory failure - evaluation and management;Acid-Base disturbance - evaluation and management  Parissa Chiao [place] 02/03/2020, 4:23 AM

## 2020-02-03 NOTE — Consult Note (Addendum)
Neurology Consultation Reason for Consult: Cardiac arrest and seizures Referring Physician: Dr. Karie Fetch  CC: Cardiac arrest  History is obtained from: Chart review as patient is intubated and sedated  HPI: Angelica Stevens is a 59 y.o. female who was brought in by EMS upon 02-21-20.  Per ED provider note, patient was in a vehicle and per bystanders was noted to have seizure-like activity.  When EMS arrived she received another defibrillation after which she was noted to be in PEA arrest.  This was followed by 2 rounds of epi as well as 3 rounds of Narcan with ROSC.  Downtime is unclear but he suspected to be about 5 minutes.  On arrival labs showed a WBC of 21.5, blood glucose 348, creatinine 1.23 with normal BUN, normal sodium, urine drug screen was positive for cocaine, phenytoin level was <2.5.  CT head without contrast showed minimal small vessel ischemic changes as well as low attenuation in left basal ganglia which could be age indeterminate lacunar infarct, no other acute intracranial abnormalities.  EEG was started which showed burst suppression with highly epileptiform discharges and therefore patient was started on Keppra and propofol.  However overnight triglycerides were elevated at 370 and therefore propofol was switched to Versed.  However EEG continues to worsen and is now showing generalized periodic epileptiform discharges at 2 to 2.5 Hz.  Therefore neurology was consulted for further management.  ROS: Unable to obtain due to altered mental status.   Past medical history, surgical history, family history and social history unable to obtain as patient is intubated and sedated.  Exam: Current vital signs: BP (!) 111/57   Pulse 73   Temp 98.6 F (37 C) (Core)   Resp 19   Ht 4' 9.48" (1.46 m)   Wt 68.4 kg Comment: minus weight of full large arctic sun pads (2.39 kg)  SpO2 100%   BMI 32.09 kg/m  Vital signs in last 24 hours: Temp:  [95.7 F (35.4 C)-98.6 F (37 C)] 98.6 F  (37 C) (11/04 0800) Pulse Rate:  [66-108] 73 (11/04 1000) Resp:  [12-30] 19 (11/04 1000) BP: (87-152)/(46-108) 111/57 (11/04 1000) SpO2:  [94 %-100 %] 100 % (11/04 1000) FiO2 (%):  [30 %] 30 % (11/04 0835) Weight:  [68.4 kg] 68.4 kg (11/04 0451)   Physical Exam  Constitutional: Laying in bed, intubated  Psych: Unable to assess due to intubation, sedation Eyes: No scleral injection HENT: No OP obstrucion Head: Normocephalic, eeg leads in place Cardiovascular: Normal rate and regular rhythm.  Respiratory: Intubated, I did not see any spontaneous breaths on vent GI: Soft.  No distension. Skin: cool Neuro: On Versed at 10 mL/h, comatose, does not open eyes to stimuli, pupils small but sluggishly reacting to light, corneal reflex absent, gag reflex present, flaccid in all 4 extremities and does not withdraw to noxious stimuli  I have reviewed labs in epic and the results pertinent to this consultation are: 02/03/2020: WBC 23.4, hemoglobin 10.3, platelet normal, phosphorus 6.2,  I have reviewed the images obtained:  CT head without contrast 02-21-20: Minimal small vessel ischemic changes of the periventricular white matter.  Low-attenuation at LEFT basal ganglia suspect age-indeterminate lacunar infarct. No other intracranial abnormalities.  Air-fluid levels in the sphenoid sinus.   ASSESSMENT/PLAN: 59 year old female with cocaine abuse status post cardiac arrest.  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  Recommendations: -We  will increase Versed to 20 mL/h and continue to uptitrate as much as possible to achieve burst suppression -Continue Keppra 1000 mg twice daily, maximum dose due to renal dysfunction -We will also loaded with valproic acid 2.5 g and start maintenance at 500 mg every 8 hours.    -Continue LTM EEG as we are titrating AEDs -We will plan for MRI brain number 72 hours after injury, on Friday or Saturday. -Patient's history, current EEG findings and exam are concerning for severe neurologic injury.  Will discuss with patient's family to understand goals of care -Continue seizure precautions -As needed IV Versed 5 mg for clinical seizure-like activity -Management of rest of comorbidities per primary team   CRITICAL CARE Performed by: Charlsie Quest   Total critical care time: 50 minutes  Critical care time was exclusive of separately billable procedures and treating other patients.  Critical care was necessary to treat or prevent imminent or life-threatening deterioration.  Critical care was time spent personally by me on the following activities: development of treatment plan with patient and/or surrogate as well as nursing, discussions with consultants, evaluation of patient's response to treatment, examination of patient, obtaining history from patient or surrogate, ordering and performing treatments and interventions, ordering and review of laboratory studies, ordering and review of radiographic studies, pulse oximetry and re-evaluation of patient's condition.     Lindie Spruce Epilepsy [facility]

## 2020-02-03 NOTE — Progress Notes (Signed)
ABG results given to Dr. Chestine Spore. Verbal order received to decrease RR to 25 from 30 and draw a repeat ABG at a later time. RT will continue to monitor.

## 2020-02-03 NOTE — Progress Notes (Signed)
Continues to be desynchronous through night resulting in reduced MV. Trials of PS/SIMV/APRV/PC patient unfortunately unable to keep up with MV needs. Lightening of propofol also resulted in recurrent seizures vs. Myoclonus, rising fevers. Will do NMB protocol, day team consider MRI if we are far enough out, I think her brain damage may be profound.  Myrla Halsted MD PCCM

## 2020-02-03 NOTE — Progress Notes (Signed)
eLink Physician-Brief Progress Note Patient Name: Angelica Stevens DOB: 21-Dec-1960 MRN: [id number]   Date of Service  02/03/2020  HPI/Events of Note  Multiple issues: 1. Hypertriglyceridemia - Triglycerides = 370. Patient on Propofol IV infusion and 2. Hypokalemia - K+ = 3.2 and Creatinine = 2.74.   eICU Interventions  Plan: 1. Versed IV infusion. Titrate to RASS = 0 to -1. 2. D/C Propofol IV infusion. 3. Replace K+.      Intervention Category Major Interventions: Electrolyte abnormality - evaluation and management;Other:  Angelica Stevens 02/03/2020, 5:50 AM

## 2020-02-03 NOTE — Progress Notes (Signed)
EEG monitor screen turned black and screen is requesting a log in to view the monitor. Called EEG help and EEG tech said the monitor is still recording but unable to fix the blacked out screen until the morning when technicians are in house to log back in.

## 2020-02-04 DIAGNOSIS — G931 Anoxic brain damage, not elsewhere classified: Secondary | ICD-10-CM

## 2020-02-04 DIAGNOSIS — F1410 Cocaine abuse, uncomplicated: Secondary | ICD-10-CM

## 2020-02-04 DIAGNOSIS — G9340 Encephalopathy, unspecified: Secondary | ICD-10-CM

## 2020-02-04 DIAGNOSIS — G40911 Epilepsy, unspecified, intractable, with status epilepticus: Secondary | ICD-10-CM

## 2020-02-04 DIAGNOSIS — Z515 Encounter for palliative care: Secondary | ICD-10-CM

## 2020-02-04 DIAGNOSIS — J9602 Acute respiratory failure with hypercapnia: Secondary | ICD-10-CM

## 2020-02-04 LAB — BASIC METABOLIC PANEL
Anion gap: 14 (ref 5–15)
BUN: 48 mg/dL — ABNORMAL HIGH (ref 6–20)
CO2: 17 mmol/L — ABNORMAL LOW (ref 22–32)
Calcium: 8.3 mg/dL — ABNORMAL LOW (ref 8.9–10.3)
Chloride: 109 mmol/L (ref 98–111)
Creatinine, Ser: 4.3 mg/dL — ABNORMAL HIGH (ref 0.44–1.00)
GFR, Estimated: 11 mL/min — ABNORMAL LOW (ref >60)
Glucose, Bld: 119 mg/dL — ABNORMAL HIGH (ref 70–99)
Potassium: 4.1 mmol/L (ref 3.5–5.1)
Sodium: 140 mmol/L (ref 135–145)

## 2020-02-04 LAB — GLUCOSE, CAPILLARY
Glucose-Capillary: 101 mg/dL — ABNORMAL HIGH (ref 70–99)
Glucose-Capillary: 108 mg/dL — ABNORMAL HIGH (ref 70–99)
Glucose-Capillary: 112 mg/dL — ABNORMAL HIGH (ref 70–99)
Glucose-Capillary: 116 mg/dL — ABNORMAL HIGH (ref 70–99)
Glucose-Capillary: 76 mg/dL (ref 70–99)
Glucose-Capillary: 89 mg/dL (ref 70–99)
Glucose-Capillary: 98 mg/dL (ref 70–99)

## 2020-02-04 LAB — CBC WITH DIFFERENTIAL/PLATELET
Abs Immature Granulocytes: 0.33 10*3/uL — ABNORMAL HIGH (ref 0.00–0.07)
Basophils Absolute: 0 10*3/uL (ref 0.0–0.1)
Basophils Relative: 0 %
Eosinophils Absolute: 0.3 10*3/uL (ref 0.0–0.5)
Eosinophils Relative: 1 %
HCT: 29.5 % — ABNORMAL LOW (ref 36.0–46.0)
Hemoglobin: 9.5 g/dL — ABNORMAL LOW (ref 12.0–15.0)
Immature Granulocytes: 2 %
Lymphocytes Relative: 7 %
Lymphs Abs: 1.3 10*3/uL (ref 0.7–4.0)
MCH: 33.6 pg (ref 26.0–34.0)
MCHC: 32.2 g/dL (ref 30.0–36.0)
MCV: 104.2 fL — ABNORMAL HIGH (ref 80.0–100.0)
Monocytes Absolute: 0.8 10*3/uL (ref 0.1–1.0)
Monocytes Relative: 5 %
Neutro Abs: 15.9 10*3/uL — ABNORMAL HIGH (ref 1.7–7.7)
Neutrophils Relative %: 85 %
Platelets: 271 10*3/uL (ref 150–400)
RBC: 2.83 MIL/uL — ABNORMAL LOW (ref 3.87–5.11)
RDW: 14.6 % (ref 11.5–15.5)
WBC: 18.6 10*3/uL — ABNORMAL HIGH (ref 4.0–10.5)
nRBC: 0 % (ref 0.0–0.2)

## 2020-02-04 LAB — MAGNESIUM: Magnesium: 2.4 mg/dL (ref 1.7–2.4)

## 2020-02-04 MED ORDER — MIDAZOLAM BOLUS VIA INFUSION
10.0000 mg | Freq: Once | INTRAVENOUS | Status: AC
  Administered 2020-02-04: 10 mg via INTRAVENOUS

## 2020-02-04 MED ORDER — FENTANYL CITRATE (PF) 100 MCG/2ML IJ SOLN: 25.0000 ug | INTRAMUSCULAR | Status: DC | PRN

## 2020-02-04 MED ORDER — VALPROIC ACID 250 MG/5ML PO SOLN
500.0000 mg | Freq: Three times a day (TID) | ORAL | Status: DC
  Administered 2020-02-04 – 2020-02-05 (×4): 500 mg
  Filled 2020-02-04 (×4): qty 10

## 2020-02-04 MED ORDER — LEVETIRACETAM IN NACL 500 MG/100ML IV SOLN
500.0000 mg | INTRAVENOUS | Status: DC
  Administered 2020-02-05: 500 mg via INTRAVENOUS
  Filled 2020-02-04: qty 100

## 2020-02-04 NOTE — Progress Notes (Addendum)
EEG reviewed at 0455. Continued GPEDs. Bolusing 10 mg Versed and increasing rate to 40 mg/hr.   Addendum: EEG reviewed at 0548. Decreased amplitude of GPEDs but still present. Bolusing 10 mg Versed and increasing rate to 50 mg/hr.   Addendum: EEG reviewed at [phone]. Decreased amplitude of GPEDs but still present. Bolusing 10 mg Versed and increasing rate to 60 mg/hr.    Electronically signed: Dr. Caryl Pina

## 2020-02-04 NOTE — Consult Note (Signed)
Consultation Note Date: 02/04/2020   Patient Name: Angelica Stevens  DOB: 27-Dec-1960  MRN: [id number]  Age / Sex: 59 y.o., female  PCP: Patient, No Pcp Per Referring Physician: Lynnell Catalan, MD  Reason for Consultation: Establishing goals of care  HPI/Patient Profile: 59 y.o. female  with past medical history of hypertension, seizures, medication noncompliance, and prolonged QT syndrome, who was admitted on 02/26/2020 after cardiac arrest. Per epic notes the patient was in her car started seizing and arrested.  The amount of downtime to ROSC is uncertain.  She did not regain consciousness after admission and was put on a cooling protocol.  Initial work-up was positive for cocaine.  She has now completed the cooling protocol, but remains intubated and heavily sedated in an attempt to suppress ongoing seizures.  Anoxic brain injury is suspected.  Unfortunately her kidney function has rapidly declined (creatinine has risen from 1.2-4.3), and she has been started on norepinephrine for blood pressure support  Clinical Assessment and Goals of Care:  I have reviewed medical records including EPIC notes, labs and imaging, received report from ICU RN, examined the patient and spoke on the phone first with her daughter Estil Daft, and then with her Sister Cleotis Nipper to discuss diagnosis prognosis, GOC, EOL wishes, disposition and options.  I introduced Palliative Medicine as specialized medical care for people living with serious illness. It focuses on providing relief from the symptoms and stress of a serious illness.   We discussed a brief life review of the patient.  Luna Kitchens describes her mother as kind of a free spirit that bounced around from place to place.  Luna Kitchens says it was hard to know what exactly was going on in her life including what types of medications she was taking.  Luna Kitchens tells me she is an only  child.  While she never talked with her mother about goals of care she has talked quite a bit with her family including her mother's siblings and her grandmother.  The 4 of them have elected Latasha as the decision maker, but Luna Kitchens states they will make decisions as a family.  Per Luna Kitchens "I do not want to see her suffer".  She understands that her mother's chances for functional recovery are poor.  She asks about next steps.  We discussed CODE STATUS.  I explained that should her mother's heart stop we would aggressively try to resuscitate her.  Luna Kitchens felt it would be very inappropriate to aggressively resuscitate her mother and potentially bring her back to where she is now.  I supported her decision and explained that I would change her CODE STATUS to DNR.   I briefly explained shifting to comfort measures.  Luna Kitchens asked me to speak to her Gwenlyn Found, and then the family would get together for a discussion.  Subsequently I called the patient's sister Cleotis Nipper and explained Ms. Harsha condition.  Lurena Joiner was very concerned and supported Latasha's comments and decision for DNR.  Lurena Joiner asked "is it time to make a decision?"  I  responded that medically it is appropriate to make decisions at this point, but the team wants the family to have time to process and come to grips with the circumstances.  I asked Lurena Joiner if a family meeting at bedside would help.  She felt that giving everyone the same information at the same time would be beneficial and we made a plan to meet on Saturday 11/6.   Primary Decision Maker:  NEXT OF KIN daughter Estil Daft.    SUMMARY OF RECOMMENDATIONS    Code status changed to DNR. Legally Estil Daft is her mother's surrogate Management consultant. Family meeting being planned for 11/6 at bedside.  Code Status/Advance Care Planning:  DNR   Symptom Management:   Per primary.  Additional Recommendations (Limitations, Scope, Preferences):  Full Scope  Treatment  Palliative Prophylaxis:   Frequent Pain Assessment  Psycho-social/Spiritual:   Desire for further Chaplaincy support: not discussed.  Prognosis:  Very poor given inability to suppress seizures, cardiac arrest, rapid renal failure and likely anoxic brain injury.    Discharge Planning: To Be Determined, unfortunately hospital death would not be surprising.      Primary Diagnoses: Present on Admission: . Cardiac arrest (HCC)   I have reviewed the medical record, interviewed the patient and family, and examined the patient. The following aspects are pertinent.  History reviewed. No pertinent past medical history. Social History   Socioeconomic History  . Marital status: Single    Spouse name: Not on file  . Number of children: Not on file  . Years of education: Not on file  . Highest education level: Not on file  Occupational History  . Not on file  Tobacco Use  . Smoking status: Not on file  Substance and Sexual Activity  . Alcohol use: Not on file  . Drug use: Not on file  . Sexual activity: Not on file  Other Topics Concern  . Not on file  Social History Narrative  . Not on file   Social Determinants of Health   Financial Resource Strain:   . Difficulty of Paying Living Expenses: Not on file  Food Insecurity:   . Worried About Programme researcher, broadcasting/film/video in the Last Year: Not on file  . Ran Out of Food in the Last Year: Not on file  Transportation Needs:   . Lack of Transportation (Medical): Not on file  . Lack of Transportation (Non-Medical): Not on file  Physical Activity:   . Days of Exercise per Week: Not on file  . Minutes of Exercise per Session: Not on file  Stress:   . Feeling of Stress : Not on file  Social Connections:   . Frequency of Communication with Friends and Family: Not on file  . Frequency of Social Gatherings with Friends and Family: Not on file  . Attends Religious Services: Not on file  . Active Member of Clubs or  Organizations: Not on file  . Attends Banker Meetings: Not on file  . Marital Status: Not on file   History reviewed. No pertinent family history.  No Known Allergies   Vital Signs: BP (!) 123/50   Pulse 75   Temp 98.6 F (37 C) (Bladder)   Resp (!) 26   Ht 4' 9.48" (1.46 m)   Wt 71.2 kg   SpO2 98%   BMI 33.40 kg/m  Pain Scale: CPOT       SpO2: SpO2: 98 % O2 Device:SpO2: 98 % O2 Flow Rate: .  Palliative Assessment/Data: 10%     Time In: 12:30 Time Out: 1:30 Time Total: 60 min. Visit consisted of counseling and education dealing with the complex and emotionally intense issues surrounding the need for palliative care and symptom management in the setting of serious and potentially life-threatening illness. Greater than 50%  of this time was spent counseling and coordinating care related to the above assessment and plan.  Signed by: Norvel Richards, PA-C Palliative Medicine  Please contact Palliative Medicine Team phone at [phone] for questions and concerns.  For individual provider: See Loretha Stapler

## 2020-02-04 NOTE — Progress Notes (Signed)
eLink Physician-Brief Progress Note Patient Name: Angelica Stevens DOB: [birth year] MRN: [id number]   Date of Service  02/04/2020  HPI/Events of Note  No AM lab orders - Nursing request for AM lab orders.   eICU Interventions  Plan: 1. CBC with platelets, BMP and Mg++ level now.  2. ABG at 7:30 AM.     Intervention Category Major Interventions: Other:  Lenell Antu 02/04/2020, 5:51 AM

## 2020-02-04 NOTE — Progress Notes (Signed)
NAME:  Angelica Stevens, MRN:  [id number], DOB:  May 03, 1960, LOS: 3 ADMISSION DATE:  02/24/2020, CONSULTATION DATE:  02-24-20 REFERRING MD:  Belia Heman, CHIEF COMPLAINT:  seizure   Brief History   58 year old woman admitted to [facility] after PEA arrest.  Transferred to [facility] for further management.  History of present illness   59 year old woman presenting with witnessed seizure activity followed by unresponsiveness.  Unclear downtime.  First responders found wide complex rhythm which they shocked.  Reported 5 minutes downtime but records are not that great.  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  UDS positive for cocaine.  Pt transferred to [facility] for further evaluation and management.  Past Medical History  Unknown  Significant Hospital Events   11/2 Transfer to [facility] from [facility]  Consults:  Neurology  Procedures:  11/2 ETT Significant Diagnostic Tests:  CT Head>> LVEF 55-60% mild LVH. Grade I diastolic dysfunction. Normal RV sustolic function. Normal pulm artery systolic pressure. Age indeterminate left lacunar infarct ECHO> Moderate aortic regurg. Moderate AI. Moderate Aortic regurg.   Micro Data:  11/2 Covid>>neg at OSH 11/3 Respiratory culture>>  Antimicrobials:  Unasyn   Interim history/subjective:  Significant escalation in midazolam dose overnight to control myoclonus.    Objective   Blood pressure (!) 124/50, pulse 70, temperature 98.6 F (37 C), temperature source Bladder, resp. rate (!) 28, height 4' 9.48" (1.46 m), weight 71.2 kg, SpO2 99 %. CVP:  [9 mmHg-21 mmHg] 21 mmHg  Vent Mode: PRVC FiO2 (%):  [30 %] 30 % Set Rate:  [26 bmp] 26 bmp Vt Set:  [320 mL] 320 mL PEEP:  [5 cmH20] 5 cmH20 Plateau Pressure:  [17 cmH20-20 cmH20] 17 cmH20   Intake/Output Summary (Last 24 hours) at 02/04/2020 1612 Last data filed at 02/04/2020 1500 Gross per 24 hour  Intake 2760.33 ml  Output 580 ml  Net 2180.33 ml   Filed Weights   02/02/20 0500 02/03/20 0451 02/04/20 0442    Weight: 66.4 kg 68.4 kg 71.2 kg    Examination: General: Critically ill appearing adult F, intubated sedated on EEG and cooling pads NAD  Neuro: Examined on versed and fentanyl. No response to pain. Pinpoint pupils.  HEENT: NCAT pink mmm, ETT secure + oral airway. Anicteric sclera  Cardiovascular: RRR s1s2 no rgm cap refill < 3 Lungs: Mechanically ventilated. CTA. Symmetrical chest expansion Abdomen: Round. Cooling pads obscure bowel sounds  Musculoskeletal: No obvious joint deformity. No cyanosis or clubbing. SCDs on BLE  Skin: cool, clean dry  Assessment & Plan:   Unwitnessed out of hospital Cardiac arrest   unclear etiology but UDS + for cocaine. Pressors off, vitals stable but poor neurologic prognosis P: - 36 degree post arrest protocol  - Levophed PRN, goal MAP > 70  Suspected anoxic brain injury Hx seizures. Persistent Epileptogenicity on EEG, Dilantin level low Prop changed to Versed due to elevated trigs 11/4 EEG with epileptogenicity with high potential for seizures and diffuse encephalopathy concerning for anoxic injury P -Keppra, Dilatin -cont versed -Prognosis is poor. Appreciate Palliative care input.  Acute respiratory failure requiring intubation in setting of cardiac arrest CXR with possible aspiration P -Full MV support -Unasyn for empiric aspiration tx -VAP, pulm hygiene   AKI Hypokalemia hypomagnesemia At risk for multiple metabolic derangements during cooling. P: -trend renal indices and UOP -correct metabolic abnormalities as able  Hyperglycemia -SSI  Leukocytosis -suspect reactive vs aspiration -continue unasyn   Best practice:  Diet: EN Pain/Anxiety/Delirium protocol (if  indicated): Versed gtt / Fentanyl gtt.  RASS goal -1. VAP protocol (if indicated): In place. DVT prophylaxis: SCD's / heparin. GI prophylaxis: PPI. Glucose control: SSI Mobility: Bedrest. Code Status: Now DNR. Family Communication: Palliative care conversation  today. Progressing to comfort care Disposition: ICU.   Labs   CBC: Recent Labs  Lab 2020/02/08 1150 02-08-2020 2113 02/02/20 0455 02/02/20 0520 02/03/20 0403 02/03/20 0407 02/03/20 0821 02/03/20 1511 02/04/20 0706  WBC 21.5*  --  23.4*  --  23.4*  --   --   --  18.6*  NEUTROABS 10.4*  --   --   --   --   --   --   --  15.9*  HGB 13.0   < > 11.5*   < > 10.3* 10.2* 9.9* 9.2* 9.5*  HCT 39.0   < > 33.3*   < > 30.5* 30.0* 29.0* 27.0* 29.5*  MCV 100.0  --  97.1  --  102.0*  --   --   --  104.2*  PLT 382  --  281  --  266  --   --   --  271   < > = values in this interval not displayed.    Basic Metabolic Panel: Recent Labs  Lab 02/08/20 2045 02/08/20 2113 02/02/20 0108 02/02/20 0108 02/02/20 0455 02/02/20 0520 02/03/20 0403 02/03/20 0407 02/03/20 0821 02/03/20 1511 02/04/20 0706  NA 138   < > 139   < > 141   < > 141 143 143 143 140  K 2.2*   < > 3.0*   < > 3.3*   < > 3.2* 3.3* 3.7 3.8 4.1  CL 102  --  106  --  108  --  110  --   --   --  109  CO2 22  --  19*  --  19*  --  19*  --   --   --  17*  GLUCOSE 115*  --  121*  --  116*  --  93  --   --   --  119*  BUN 21*  --  22*  --  22*  --  31*  --   --   --  48*  CREATININE 1.92*  --  2.10*  --  2.21*  --  2.74*  --   --   --  4.30*  CALCIUM 8.3*  --  8.3*  --  7.8*  --  8.3*  --   --   --  8.3*  MG  --   --   --   --  1.4*  --  2.4  --   --   --  2.4  PHOS  --   --   --   --  3.0  --  6.2*  --   --   --   --    < > = values in this interval not displayed.   GFR: Estimated Creatinine Clearance: 11.8 mL/min (A) (by C-G formula based on SCr of 4.3 mg/dL (H)). Recent Labs  Lab February 08, 2020 1150 02/02/20 0455 02/03/20 0403 02/04/20 0706  WBC 21.5* 23.4* 23.4* 18.6*    Liver Function Tests: Recent Labs  Lab 02-08-2020 1150  AST 76*  ALT 20  ALKPHOS 95  BILITOT 1.2  PROT 8.0  ALBUMIN 3.7   No results for input(s): LIPASE, AMYLASE in the last 168 hours. No results for input(s): AMMONIA in the last 168 hours.  ABG  Component Value Date/Time   PHART 7.208 (L) 02/03/2020 1511   PCO2ART 51.6 (H) 02/03/2020 1511   PO2ART 73 (L) 02/03/2020 1511   HCO3 20.5 02/03/2020 1511   TCO2 22 02/03/2020 1511   ACIDBASEDEF 7.0 (H) 02/03/2020 1511   O2SAT 90.0 02/03/2020 1511     Coagulation Profile: Recent Labs  Lab 02/04/2020 2045 02/02/20 0455  INR 1.1 1.2    CRITICAL CARE Performed by: Lynnell Catalan   Total critical care time: 40 minutes  Critical care time was exclusive of separately billable procedures and treating other patients. Critical care was necessary to treat or prevent imminent or life-threatening deterioration.  Critical care was time spent personally by me on the following activities: development of treatment plan with patient and/or surrogate as well as nursing, discussions with consultants, evaluation of patient's response to treatment, examination of patient, obtaining history from patient or surrogate, ordering and performing treatments and interventions, ordering and review of laboratory studies, ordering and review of radiographic studies, pulse oximetry and re-evaluation of patient's condition.  Lynnell Catalan, MD [facility] ICU Physician [facility]  Pager: [phone] Mobile: [phone] After hours: [phone].  02/04/2020, 4:23 PM      02/04/2020, 4:12 PM

## 2020-02-04 NOTE — Progress Notes (Signed)
Family meeting for goals of care in the AM. She continues to require all measures for life support and treatment of seizures pending possible changes to plan after family meets in the AM.   EEG reviewed at 7:44 PM. Continued GPEDs, but low amplitude on a flat background. Bolusing 10 mg Versed and increasing rate to 70 mg/hr.  Electronically signed: Dr. Caryl Pina

## 2020-02-04 NOTE — Progress Notes (Signed)
RT in for routine vent check. Upon repositioning ETT small sore noted to the left top part of the pts tongue. No active bleeding noted at this time. RT will continue to monitor. RN notified.

## 2020-02-04 NOTE — Progress Notes (Signed)
vLTM EEG maintanence complete. No skin breakdown at FP1 FP2 O2 F8 continue to monitor

## 2020-02-04 NOTE — Procedures (Addendum)
Patient Name:Angelica Stevens OHK:[id number] Epilepsy Attending:Ayodele Hartsock Annabelle Harman Referring Physician/Provider:Rahul [name], [place] Duration:02/03/2020 2226 to 11/5/20212226  Patient history:59 year old female status post cardiac arrest. EEG to evaluate for seizures.  Level of alertness:Comatose  AEDs during EEG study:Phenytoin, Keppra, versed  Technical aspects: This EEG study was done with scalp electrodes positioned according to the 10-20 International system of electrode placement. Electrical activity was acquired at a sampling rate of 500Hz  and reviewed with a high frequency filter of 70Hz  and a low frequency filter of 1Hz . EEG data were recorded continuously and digitally stored.   Description:EEG showed generalized, maximal bifrontal periodic epileptiform discharges with overriding fast activity to 1.5-2hz  as well as background eeg suppression in between.  ABNORMALITY -Periodic epileptiform discharges with overriding fast activity, generalized - EEG suppression, generalized  IMPRESSION: This studyshowed evidence of generalized epileptogenicity at 1.5-2Hz  which is on the ictal - interictal continuum with high potential for seizures.  There is also profounddiffuseencephalopathy, nonspecific etiology but likely secondary to anoxic/hypoxic brain injury.   EEG is stable compared to previous day.

## 2020-02-04 NOTE — Progress Notes (Addendum)
Neurology Progress Note  Patient ID: Angelica Stevens is a 59 y.o. p/w cardiac arrest and suspected anoxic brain injury PMHx of cocaine use, admit date 02/18/2020  - Underwent TTM - 11/4 switched propofol to versed due to elevated triglycerides  Major interval events/Subjective: - intubated, sedated, cannot obtain subjective assessment  - increased versed overnight, currently at rate of 60  Exam: Vitals:   02/04/20 0200 02/04/20 0215  BP: (!) 129/51 (!) 129/52  Pulse: 72 71  Resp: 19 16  Temp:  98.8 F (37.1 C)  SpO2: 99% 99%   Gen: In bed, comfortable  Resp: non-labored breathing, no grossly audible wheezing Cardiac: Perfusing extremities well  Abd: soft, nt  Neuro: MS: no response  CN: left pupil 1.5 -> 1.25 mm possibly, right pupil nonreactive, no VOR, no cough/gag per nursing,  Motor/Sensory: flaccid tone, no response to stimulation in any extremity   Pertinent Labs: Results for ARNITA, KOONS (MRN [id number]) as of 02/04/2020 03:35  Ref. Range 02/03/2020 15:11  pH, Arterial Latest Ref Range: 7.35 - 7.45  7.208 (L)  pCO2 arterial Latest Ref Range: 32 - 48 mmHg 51.6 (H)  pO2, Arterial Latest Ref Range: 83 - 108 mmHg 73 (L)  TCO2 Latest Ref Range: 22 - 32 mmol/L 22  Acid-base deficit Latest Ref Range: 0.0 - 2.0 mmol/L 7.0 (H)  Bicarbonate Latest Ref Range: 20.0 - 28.0 mmol/L 20.5  O2 Saturation Latest Units: % 90.0  Patient temperature Unknown 37.0 C  Collection site Unknown Radial    Cr today >4 from 2.74 11/4, 1.23 on admission 11/2  Impression: 59 year old female with cocaine abuse status post cardiac arrest, with worsening multiorgan failure and worsening EEG findings. Extremely grim prognosis, high likelihood of progression to brain death based on clinical experience.   Cardiac arrest Suspected anoxic/hypoxic brain injury Cocaine use disorder Leukocytosis Microcytic anemia Acute renal failure  Acidemia  Hypoxia Hypercarbia Hypertriglyceridemia -LTM EEG is  continuing to worsen evidence of generalized epileptogenicityat1.5-2Hz which is on the ictal - interictal continuum with high potential for seizures.  Recommendations: Appreciate primary team's ongoing goals of care discussion with family. Neurology will try to attend if code strokes and emergent consult volumes permit, if the primary team and family desires.   If family moves to comfort care with current information, that would be appropriate given extremely poor prognosis based on total clinical course to date with continued seizures and worsening organ function.   If family is not yet ready to move to comfort care, please pause LTM for MRI brain to assess for structural brain injury - reduce Keppra 500 mg BID (maxed for renal function)  - continue Depakote 500 mg q8hr   - level at 1 PM to confirm appropriate level iso worsening organ function - continue Versed at 60 mg/hr, max rate of 136 mg/hr given her weight of 68.4 kg (2 mg/kg/hr)  Will leave LTM in place pending goals of care conversations.   Brooke Dare MD-PhD [facility] [phone]

## 2020-02-04 NOTE — Progress Notes (Signed)
As advised by Sedalia Muta, Pharmacist, I spoke with April Harper, RN via phone regarding Versed gtt discrepancy to correct ASAP.  Ms. Clearance Coots RN will be in to correct the discrepancy at 7 am 02/24/2020.  I relayed this back to Diane in the pharmacy.

## 2020-02-05 DIAGNOSIS — Z66 Do not resuscitate: Secondary | ICD-10-CM

## 2020-02-05 DIAGNOSIS — Z515 Encounter for palliative care: Secondary | ICD-10-CM

## 2020-02-05 LAB — CBC WITH DIFFERENTIAL/PLATELET
Abs Immature Granulocytes: 0.11 10*3/uL — ABNORMAL HIGH (ref 0.00–0.07)
Basophils Absolute: 0 10*3/uL (ref 0.0–0.1)
Basophils Relative: 0 %
Eosinophils Absolute: 0.1 10*3/uL (ref 0.0–0.5)
Eosinophils Relative: 0 %
HCT: 27.8 % — ABNORMAL LOW (ref 36.0–46.0)
Hemoglobin: 8.8 g/dL — ABNORMAL LOW (ref 12.0–15.0)
Immature Granulocytes: 1 %
Lymphocytes Relative: 5 %
Lymphs Abs: 0.6 10*3/uL — ABNORMAL LOW (ref 0.7–4.0)
MCH: 33.3 pg (ref 26.0–34.0)
MCHC: 31.7 g/dL (ref 30.0–36.0)
MCV: 105.3 fL — ABNORMAL HIGH (ref 80.0–100.0)
Monocytes Absolute: 0.7 10*3/uL (ref 0.1–1.0)
Monocytes Relative: 5 %
Neutro Abs: 11.3 10*3/uL — ABNORMAL HIGH (ref 1.7–7.7)
Neutrophils Relative %: 89 %
Platelets: 197 10*3/uL (ref 150–400)
RBC: 2.64 MIL/uL — ABNORMAL LOW (ref 3.87–5.11)
RDW: 14.3 % (ref 11.5–15.5)
WBC: 12.8 10*3/uL — ABNORMAL HIGH (ref 4.0–10.5)
nRBC: 0.2 % (ref 0.0–0.2)

## 2020-02-05 LAB — BASIC METABOLIC PANEL
Anion gap: 14 (ref 5–15)
BUN: 58 mg/dL — ABNORMAL HIGH (ref 6–20)
CO2: 17 mmol/L — ABNORMAL LOW (ref 22–32)
Calcium: 8.4 mg/dL — ABNORMAL LOW (ref 8.9–10.3)
Chloride: 114 mmol/L — ABNORMAL HIGH (ref 98–111)
Creatinine, Ser: 4.95 mg/dL — ABNORMAL HIGH (ref 0.44–1.00)
GFR, Estimated: 10 mL/min — ABNORMAL LOW (ref >60)
Glucose, Bld: 115 mg/dL — ABNORMAL HIGH (ref 70–99)
Potassium: 4.3 mmol/L (ref 3.5–5.1)
Sodium: 145 mmol/L (ref 135–145)

## 2020-02-05 LAB — GLUCOSE, CAPILLARY
Glucose-Capillary: 102 mg/dL — ABNORMAL HIGH (ref 70–99)
Glucose-Capillary: 85 mg/dL (ref 70–99)
Glucose-Capillary: 112 mg/dL — ABNORMAL HIGH (ref 70–99)

## 2020-02-05 LAB — CULTURE, RESPIRATORY W GRAM STAIN

## 2020-02-05 LAB — MAGNESIUM: Magnesium: 2.5 mg/dL — ABNORMAL HIGH (ref 1.7–2.4)

## 2020-02-05 MED ORDER — DEXTROSE 5 % IV SOLN: INTRAVENOUS | Status: DC

## 2020-02-05 MED ORDER — GLYCOPYRROLATE 0.2 MG/ML IJ SOLN: 0.2000 mg | INTRAMUSCULAR | Status: DC | PRN

## 2020-02-05 MED ORDER — GLYCOPYRROLATE 1 MG PO TABS
1.0000 mg | ORAL_TABLET | ORAL | Status: DC | PRN
  Filled 2020-02-05: qty 1

## 2020-02-05 MED ORDER — MIDAZOLAM HCL 2 MG/2ML IJ SOLN
20.0000 mg | INTRAMUSCULAR | Status: DC | PRN
  Filled 2020-02-05: qty 20

## 2020-02-05 MED ORDER — GLYCOPYRROLATE 0.2 MG/ML IJ SOLN
0.2000 mg | INTRAMUSCULAR | Status: DC | PRN
  Administered 2020-02-05: 0.2 mg via INTRAVENOUS
  Filled 2020-02-05: qty 1

## 2020-02-05 MED ORDER — ACETAMINOPHEN 325 MG PO TABS: 650.0000 mg | ORAL_TABLET | Freq: Four times a day (QID) | ORAL | Status: DC | PRN

## 2020-02-05 MED ORDER — DIPHENHYDRAMINE HCL 50 MG/ML IJ SOLN: 25.0000 mg | INTRAMUSCULAR | Status: DC | PRN

## 2020-02-05 MED ORDER — HYDROMORPHONE BOLUS VIA INFUSION
2.0000 mg | INTRAVENOUS | Status: DC | PRN
  Filled 2020-02-05: qty 2

## 2020-02-05 MED ORDER — SODIUM CHLORIDE 0.9 % IV SOLN
2.0000 mg/h | INTRAVENOUS | Status: DC
  Administered 2020-02-05: 2 mg/h via INTRAVENOUS
  Filled 2020-02-05: qty 2.5

## 2020-02-05 MED ORDER — POLYVINYL ALCOHOL 1.4 % OP SOLN
1.0000 [drp] | Freq: Four times a day (QID) | OPHTHALMIC | Status: DC | PRN
  Filled 2020-02-05: qty 15

## 2020-02-05 MED ORDER — ACETAMINOPHEN 650 MG RE SUPP: 650.0000 mg | Freq: Four times a day (QID) | RECTAL | Status: DC | PRN

## 2020-02-10 LAB — CULTURE, BLOOD (ROUTINE X 2)
Culture: NO GROWTH
Culture: NO GROWTH
Special Requests: ADEQUATE

## 2020-03-01 NOTE — Progress Notes (Signed)
Pt noted to be asystole on monitor. Two RNs auscultated for a minute. No breath sounds or heart sounds noted. Family at bedside. Time of death is 1458. MD notified.

## 2020-03-01 NOTE — Progress Notes (Signed)
Daily Progress Note   Patient Name: Angelica Stevens       Date: 03-04-2020 DOB: October 26, 1960  Age: 59 y.o. MRN#: [id number] Attending Physician: Kipp Brood, MD Primary Care Physician: Patient, No Pcp Per Admit Date: 02/17/2020  Reason for Consultation/Follow-up:  To discuss complex medical decision making related to patient's goals of care.  Patient still with seizures requiring versed drip to be increased to 70/hr. Creatinine rising (4.95).   Able to wean.  Subjective: Met with mother, brother Mariella Saa), sister Wells Guiles), daughter Roderic Ovens), and Silva Bandy at first at bedside and then privately in the conference room.  Together we discussed [name]'s current condition with her mother and her family.  Her mother came to understand that we are unable to stop Letta Median from seizing.  Dr. Lynetta Mare helped the family to understand that her myoclonic seizures were very abnormal - the type of seizures that arise from brain damage.  Questions were asked and answered and the family came to the decision to focus on [name]'s comfort and withdraw artificial life support.  They did not want to see Letta Median in this condition for a prolonged period.  Family opted to stay for a compassionate extubation.   Assessment: Low dilantin level and cocaine positive on admission with seizures post cardiac arrest.  Patient requiring high amount of sedation in order to prevent visible seizures.  Per neuro EEG shows profound encephalopathy likely secondary to anoxic brain injury.  Kidney function continuing to decline rapidly.  Patient Profile/HPI:    59 y.o. female  with past medical history of hypertension, seizures, medication noncompliance, and prolonged QT syndrome, who was admitted on 02/24/2020 after cardiac arrest. Per epic notes  the patient was in her car started seizing and arrested.  The amount of downtime to ROSC is uncertain.  She did not regain consciousness after admission and was put on a cooling protocol.  Initial work-up was positive for cocaine.  She has now completed the cooling protocol, but remains intubated and heavily sedated in an attempt to suppress ongoing seizures.  Anoxic brain injury is suspected.  Unfortunately her kidney function has rapidly declined (creatinine has risen from 1.2-4.3), and she has been started on norepinephrine for blood pressure support.  Length of Stay: 4   Vital Signs: BP (!) 122/59   Pulse 77   Temp 98.6 F (  37 C) (Bladder)   Resp 16   Ht 4' 9.48" (1.46 m)   Wt 73.5 kg   SpO2 97%   BMI 34.48 kg/m  SpO2: SpO2: 97 % O2 Device: O2 Device: Ventilator O2 Flow Rate:         Palliative Assessment/Data: 10%     Palliative Care Plan    Recommendations/Plan:  Comfort measures only  Comfort orders placed.  Patient extubated smoothly and passed away 10 - 15 minutes later with family at bedside.  Code Status:  DNR  Prognosis:  Minutes to hours.  Patient passed away within 15 minutes of extubation.   Discharge Planning:  Anticipated Hospital Death  Care plan was discussed with ICU RNs, CCM MD, family.  Thank you for allowing the Palliative Medicine Team to assist in the care of this patient.  Total time spent:  70 min.     Greater than 50%  of this time was spent counseling and coordinating care related to the above assessment and plan.  Florentina Jenny, PA-C Palliative Medicine  Please contact Palliative MedicineTeam phone at [phone] for questions and concerns between 7 am - 7 pm.   Please see AMION for individual provider pager numbers.

## 2020-03-01 NOTE — Procedures (Addendum)
Patient Name:Aadhira Nghiem KPV:[id number] Epilepsy Attending:Rylyn Ranganathan Annabelle Harman Referring Physician/Provider:Rahul [name], [place] Duration:02/04/2020 2226 to 02/27/2020 1121  Patient history:59 year old female status post cardiac arrest. EEG to evaluate for seizures.  Level of alertness:Comatose  AEDs during EEG study:Phenytoin, Keppra,versed  Technical aspects: This EEG study was done with scalp electrodes positioned according to the 10-20 International system of electrode placement. Electrical activity was acquired at a sampling rate of 500Hz  and reviewed with a high frequency filter of 70Hz  and a low frequency filter of 1Hz . EEG data were recorded continuously and digitally stored.   Description:EEGshowed generalized, maximal bifrontal periodic epileptiform discharges with overriding fast activityto 1.5-2hz  which gradually reduced to 0.25hz . There is also background eeg suppression in between.  ABNORMALITY - Periodic epileptiform discharges with overriding fast activity, generalized - EEG suppression, generalized  IMPRESSION: This studyshowed evidence of generalized epileptogenicity as well asprofounddiffuseencephalopathy, nonspecific etiology but likely secondary to anoxic/hypoxic brain injury.  Ricci Paff 

## 2020-03-01 NOTE — Death Summary Note (Signed)
DEATH SUMMARY   Patient Details  Name: Angelica Stevens MRN: [id number] DOB: 10/31/1960  Admission/Discharge Information   Admit Date:  2020-02-20  Date of Death: Date of Death: 24-Feb-2020  Time of Death: Time of Death: 18-Aug-1456  Length of Stay: 4  Referring Physician: Patient, No Pcp Per   Reason(s) for Hospitalization  Cardiac arrest  Diagnoses  Preliminary cause of death: Anoxic encephalopathy Secondary Diagnoses (including complications and co-morbidities):  Active Problems:   Cardiac arrest (HCC)   Hypokalemia   Encephalopathy acute   Acute respiratory failure with hypoxia (HCC)   Cocaine abuse (HCC)   AKI (acute kidney injury) (HCC)   Hyperglycemia   Intractable epilepsy with status epilepticus [facility])   Palliative care encounter   Comfort measures only status   DNR (do not resuscitate)   Brief Hospital Course (including significant findings, care, treatment, and services provided and events leading to death)  Angelica Stevens is a 59 y.o. year old female who suffered a prolonged cardiac arrest due to PEA following a protracted seizure.  She was resuscitated but developed myoclonic status epilepticus which remained refractory to multiple anticonvulsants and high-dose sedative infusions.  This portended a dismal neurological prognosis.  Palliative care was involved and following discussions with the family they opted to proceed with compassionate extubation and transition to comfort care.   Pertinent Labs and Studies  Significant Diagnostic Studies CT Head Wo Contrast  Result Date: February 20, 2020 CLINICAL DATA:  Mental status changes, unknown cause, became unresponsive after 2 seizures EXAM: CT HEAD WITHOUT CONTRAST TECHNIQUE: Contiguous axial images were obtained from the base of the skull through the vertex without intravenous contrast. Sagittal and coronal MPR images reconstructed from axial data set. COMPARISON:  None FINDINGS: Brain: Slight asymmetry of the lateral ventricles RIGHT  slightly larger than LEFT. No hydrocephalus, midline shift or mass effect. Minimal hypoattenuation of the periventricular white matter. Low-attenuation at LEFT basal ganglia question age-indeterminate lacunar infarct. No intracranial hemorrhage, mass lesion or additional infarcts identified. No extra-axial fluid collections. Vascular: No hyperdense vessels. Minimal atherosclerotic calcification of internal carotid arteries bilaterally at skull base Skull: Intact Sinuses/Orbits: Small air-fluid levels in the sphenoid sinus. Remaining paranasal sinuses and mastoid air cells clear Other: N/A IMPRESSION: Minimal small vessel ischemic changes of the periventricular white matter. Low-attenuation at LEFT basal ganglia suspect age-indeterminate lacunar infarct. No other intracranial abnormalities. Air-fluid levels in the sphenoid sinus. Electronically Signed   By: Ulyses Southward M.D.   On: February 20, 2020 12:38   DG Chest Port 1 View  Result Date: 02/02/2020 CLINICAL DATA:  59 year old female with a history of cardiac arrest EXAM: PORTABLE CHEST 1 VIEW COMPARISON:  2020-02-20 FINDINGS: Cardiomediastinal silhouette unchanged in size and contour. If ever later pads project over the left hemithorax. Unchanged left subclavian central venous catheter with the tip appearing to terminate superior vena cava. Unchanged endotracheal tube terminating 1.7 cm above the carina. Unchanged gastric tube terminating out of the field of view. No pneumothorax. No large pleural effusion. Improved aeration in the left lung with partial resolution of mixed interstitial/airspace opacity. Similar appearance of fullness in the central vasculature with interlobular septal thickening. IMPRESSION: Improving mixed interstitial and airspace opacity in the left lung, with persisting fullness in the hilar regions bilaterally, potentially reflecting early edema, sequela of aspiration/pneumonitis, pulmonary contusion. No pneumothorax or large pleural effusion.  Endotracheal tube again terminates just above the carina, proximally 1.7 cm. Unchanged gastric tube and left subclavian central venous catheter. Defibrillator pads unchanged Electronically Signed   By: Marijean Niemann  Loreta Ave D.O.   On: 02/02/2020 08:13   DG CHEST PORT 1 VIEW  Result Date: 02/24/2020 CLINICAL DATA:  Central line placement. EXAM: PORTABLE CHEST 1 VIEW COMPARISON:  Same day. FINDINGS: Stable cardiomegaly. Endotracheal and nasogastric tubes are unchanged in position. Left subclavian catheter is noted with distal tip in expected position of the SVC. No pneumothorax or pleural effusion is noted. Increased left perihilar opacity is noted concerning for possible pneumonia. Bony thorax is unremarkable. IMPRESSION: Stable support apparatus. Increased left perihilar opacity is noted concerning for possible pneumonia. Stable left subclavian catheter. No pneumothorax seen. Electronically Signed   By: Lupita Raider M.D.   On: 01/31/2020 20:41   DG Chest Portable 1 View  Result Date: 02/10/2020 CLINICAL DATA:  Post arrest.  Intubation EXAM: PORTABLE CHEST 1 VIEW COMPARISON:  No prior. FINDINGS: Endotracheal tube tip noted 3 cm above the carina. NG tube noted with tip over the mid esophagus. Advancement of 20 cm should be considered. Cardiomegaly. Mild bilateral interstitial prominence. Mild CHF cannot be excluded. No pleural effusion or pneumothorax. IMPRESSION: 1. Endotracheal tube tip noted 3 cm above the carina. 2. NG tube noted with tip over the mid esophagus. Advancement of 20 cm should be considered. 3. Cardiomegaly. Mild bilateral interstitial prominence. Mild CHF cannot be excluded. No pneumothorax. Electronically Signed   By: Maisie Fus  Register   On: 02/26/2020 12:21   DG Abd Portable 1V  Result Date: 02/22/2020 CLINICAL DATA:  Orogastric tube placement. EXAM: PORTABLE ABDOMEN - 1 VIEW.  Semi erect. COMPARISON:  None. FINDINGS: Interval placement of an enteric tube with tip and side port coursing below  the diaphragm overlying the expected region of the gastric lumen. The bowel gas pattern is normal. No radio-opaque calculi or other significant radiographic abnormality are seen. IMPRESSION: 1. Enteric tube in good position. 2. Nonobstructive bowel gas pattern. Electronically Signed   By: Tish Frederickson M.D.   On: 02/22/2020 20:14   EEG adult  Result Date: 02/02/2020 Angelica Quest, MD     02/02/2020  9:31 AM Patient Name: Angelica Stevens MRN: [id number] Epilepsy Attending: Charlsie Stevens Referring Physician/Provider: Rutherford Guys, PA Date: 02/27/2020 Duration: 24.04 minutes Patient history: 59 year old female status post cardiac arrest. EEG to evaluate for seizures. Level of alertness: Comatose AEDs during EEG study: Phenytoin, Keppra, propofol Technical aspects: This EEG study was done with scalp electrodes positioned according to the 10-20 International system of electrode placement. Electrical activity was acquired at a sampling rate of 500Hz  and reviewed with a high frequency filter of 70Hz  and a low frequency filter of 1Hz . EEG data were recorded continuously and digitally stored. Description: EEG showed near continuous generalized background suppression. Brief (<0.5s) intermittent polymorphic 3 to 5 Hz theta-delta slowing admixed with generalized polyspikes were also noted. Hyperventilation and photic stimulation were not performed.   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 No seizures were seen throughout the recording. Angelica   Overnight EEG with video  Result Date: 02/02/2020 , MD     02/03/2020  9:38 AM Patient Name: Angelica Stevens MRN: Angelica Stevens Epilepsy Attending: 13/07/2019 Referring Physician/Provider: Carie Caddy,  PA Duration: 02/14/2020 2226 to 02/02/2020 2226   Patient history: 59 year old female status post cardiac arrest. EEG to evaluate for seizures.  Level of alertness: Comatose  AEDs during EEG study: Phenytoin, Keppra, propofol  Technical aspects: This EEG study was done with scalp electrodes positioned according to the 10-20 International system of electrode placement. Electrical activity was acquired at a sampling rate of 500Hz  and reviewed with a high frequency filter of 70Hz  and a low frequency filter of 1Hz . EEG data were recorded continuously and digitally stored. Description: EEG initially showed burst suppression pattern with bursts of generalized polyspikes lasting about 1 to 2 seconds every 30-60 seconds on an average.  After around 9am on 02/02/2020, bursts became more frequent happening every 3 to 5 seconds.  Gradually the became more frequent with generalized periodic epileptiform discharges with overriding fast activity at 0.25 to 1 Hz.  Of note, per critical care team patient was noted to have intermittent jerking of bilateral upper extremities is difficult to visualize on video.  ABNORMALITY -Burst suppression with highly epileptiform discharges, generalized IMPRESSION: This study showed evidence of generalized epileptogenicity as well as profound diffuse encephalopathy, nonspecific etiology but likely secondary to anoxic/hypoxic brain injury. Of note, per critical care team patient was noted to have intermittent jerking of bilateral upper extremity which is difficult to visualize on video but are likely myoclonic seizures given history and EEG findings. Angelica Stevens   ECHOCARDIOGRAM COMPLETE  Result Date: 02/02/2020    ECHOCARDIOGRAM REPORT   Patient Name:   Angelica Stevens Date of Exam: 02/02/2020 Medical Rec #:  [id number]    Height:       57.5 in Accession #:    [id number]   Weight:       146.4 lb Date of Birth:  [birth year]    BSA:          1.585 m Patient Age:    59 years     BP:            94/60 mmHg Patient Gender: F            HR:           70 bpm. Exam Location:  Inpatient Procedure: 2D Echo Indications:    Cardiac Arrest  History:        Patient has no prior history of Echocardiogram examinations.  Sonographer:    Thurman Coyer RDCS (AE) Referring Phys: [id number] [facility] P DESAI  Sonographer Comments: Echo performed with patient supine and on artificial respirator. IMPRESSIONS  1. Moderate aortic valve regurgitation. Vena contracta 0.49 cm. PHT 477 ms. Aorta Doppler suboptimal to assess for reversals. Normal LV size. Based on parasternal color flow, AI may be moderate to moderate-severe. Ascending aorta not well visualized, no  definite etiology for aortic valve regurgitation based on this echo. . The aortic valve is tricuspid. Aortic valve regurgitation is moderate. No aortic stenosis is present.  2. Left ventricular ejection fraction, by estimation, is 55 to 60%. The left ventricle has normal function. The left ventricle has no regional wall motion abnormalities. There is mild left ventricular hypertrophy. Left ventricular diastolic parameters are consistent with Grade I diastolic dysfunction (impaired relaxation).  3. Right ventricular systolic function is normal. The right ventricular size is normal. There is normal pulmonary artery systolic pressure. The estimated right ventricular systolic pressure is 23.8 mmHg.  4. The mitral valve is normal in structure. No evidence of mitral valve regurgitation. No evidence of mitral stenosis.  5. The inferior vena cava is normal in size with greater than 50% respiratory variability, suggesting right atrial pressure of 3 mmHg. FINDINGS  Left Ventricle: Left ventricular ejection fraction, by estimation, is 55 to 60%. The left ventricle has  normal function. The left ventricle has no regional wall motion abnormalities. The left ventricular internal cavity size was normal in size. There is  mild left ventricular hypertrophy. Left ventricular diastolic  parameters are consistent with Grade I diastolic dysfunction (impaired relaxation). Right Ventricle: The right ventricular size is normal. No increase in right ventricular wall thickness. Right ventricular systolic function is normal. There is normal pulmonary artery systolic pressure. The tricuspid regurgitant velocity is 2.28 m/s, and  with an assumed right atrial pressure of 3 mmHg, the estimated right ventricular systolic pressure is 23.8 mmHg. Left Atrium: Left atrial size was normal in size. Right Atrium: Right atrial size was normal in size. Pericardium: There is no evidence of pericardial effusion. Mitral Valve: The mitral valve is normal in structure. No evidence of mitral valve regurgitation. No evidence of mitral valve stenosis. Tricuspid Valve: The tricuspid valve is normal in structure. Tricuspid valve regurgitation is not demonstrated. No evidence of tricuspid stenosis. Aortic Valve: Moderate aortic valve regurgitation. Vena contracta 0.49 cm. PHT 477 ms. Aorta Doppler suboptimal to assess for reversals. Normal LV size. Based on parasternal color flow, AI may be moderate to moderate-severe. Ascending aorta not well visualized, no definite etiology for aortic valve regurgitation based on this echo. The aortic valve is tricuspid. Aortic valve regurgitation is moderate. Aortic regurgitation PHT measures 477 msec. No aortic stenosis is present. Pulmonic Valve: The pulmonic valve was not well visualized. Pulmonic valve regurgitation is not visualized. No evidence of pulmonic stenosis. Aorta: The aortic root is normal in size and structure and the ascending aorta was not well visualized. Venous: The inferior vena cava is normal in size with greater than 50% respiratory variability, suggesting right atrial pressure of 3 mmHg. IAS/Shunts: No atrial level shunt detected by color flow Doppler.  LEFT VENTRICLE PLAX 2D LVIDd:         4.80 cm  Diastology LVIDs:         2.90 cm  LV e' medial:    3.59 cm/s LV PW:          1.10 cm  LV E/e' medial:  20.6 LV IVS:        1.20 cm  LV e' lateral:   3.94 cm/s LVOT diam:     2.00 cm  LV E/e' lateral: 18.8 LV SV:         90 LV SV Index:   57 LVOT Area:     3.14 cm  RIGHT VENTRICLE RV S prime:     12.70 cm/s TAPSE (M-mode): 1.8 cm LEFT ATRIUM             Index       RIGHT ATRIUM           Index LA diam:        3.00 cm 1.89 cm/m  RA Area:     13.40 cm LA Vol (A2C):   47.6 ml 30.04 ml/m RA Volume:   28.30 ml  17.86 ml/m LA Vol (A4C):   43.7 ml 27.58 ml/m LA Biplane Vol: 50.0 ml 31.55 ml/m  AORTIC VALVE LVOT Vmax:         131.00 cm/s LVOT Vmean:        90.500 cm/s LVOT VTI:          0.285 m AI PHT:            477 msec AR Vena Contracta: 0.49 cm  AORTA Ao Root diam: 3.00 cm MITRAL VALVE  TRICUSPID VALVE MV Area (PHT): 2.99 cm     TR Peak grad:   20.8 mmHg MV Decel Time: 254 msec     TR Vmax:        228.00 cm/s MV E velocity: 74.10 cm/s MV A velocity: 108.00 cm/s  SHUNTS MV E/A ratio:  0.69         Systemic VTI:  0.29 m                             Systemic Diam: 2.00 cm Angelica Brass MD Electronically signed by Angelica Brass MD Signature Date/Time: 02/02/2020/2:39:46 PM    Final     Microbiology Recent Results (from the past 240 hour(s))  Respiratory Panel by RT PCR (Flu A&B, Covid) - Nasopharyngeal Swab     Status: None   Collection Time: 02/13/2020 11:50 AM   Specimen: Nasopharyngeal Swab  Result Value Ref Range Status   SARS Coronavirus 2 by RT PCR NEGATIVE NEGATIVE Final    Comment: (NOTE) SARS-CoV-2 target nucleic acids are NOT DETECTED.  The SARS-CoV-2 RNA is generally detectable in upper respiratoy specimens during the acute phase of infection. The lowest concentration of SARS-CoV-2 viral copies this assay can detect is 131 copies/mL. A negative result does not preclude SARS-Cov-2 infection and should not be used as the sole basis for treatment or other patient management decisions. A negative result may occur with  improper specimen  collection/handling, submission of specimen other than nasopharyngeal swab, presence of viral mutation(s) within the areas targeted by this assay, and inadequate number of viral copies (<131 copies/mL). A negative result must be combined with clinical observations, patient history, and epidemiological information. The expected result is Negative.  Fact Sheet for Patients:  https://www.moore.com/  Fact Sheet for Healthcare Providers:  https://www.young.biz/  This test is no t yet approved or cleared by the [place] FDA and  has been authorized for detection and/or diagnosis of SARS-CoV-2 by FDA under an Emergency Use Authorization (EUA). This EUA will remain  in effect (meaning this test can be used) for the duration of the COVID-19 declaration under Section 564(b)(1) of the Act, 21 U.S.C. section 360bbb-3(b)(1), unless the authorization is terminated or revoked sooner.     Influenza A by PCR NEGATIVE NEGATIVE Final   Influenza B by PCR NEGATIVE NEGATIVE Final    Comment: (NOTE) The Xpert Xpress SARS-CoV-2/FLU/RSV assay is intended as an aid in  the diagnosis of influenza from Nasopharyngeal swab specimens and  should not be used as a sole basis for treatment. Nasal washings and  aspirates are unacceptable for Xpert Xpress SARS-CoV-2/FLU/RSV  testing.  Fact Sheet for Patients: https://www.moore.com/  Fact Sheet for Healthcare Providers: https://www.young.biz/  This test is not yet approved or cleared by the [place] FDA and  has been authorized for detection and/or diagnosis of SARS-CoV-2 by  FDA under an Emergency Use Authorization (EUA). This EUA will remain  in effect (meaning this test can be used) for the duration of the  Covid-19 declaration under Section 564(b)(1) of the Act, 21  U.S.C. section 360bbb-3(b)(1), unless the authorization is  terminated or revoked. Performed at [facility], [address] [place], [place] [address]   Blood culture (routine x 2)     Status: None (Preliminary result)   Collection Time: Feb 13, 2020  1:47 PM   Specimen: BLOOD  Result Value Ref Range Status   Specimen Description BLOOD RIGHT ANTECUBITAL  Final  Special Requests   Final    BOTTLES DRAWN AEROBIC AND ANAEROBIC Blood Culture adequate volume   Culture   Final    NO GROWTH 4 DAYS Performed at [facility], [address] [place], [place] [address]    Report Status PENDING  Incomplete  Blood culture (routine x 2)     Status: None (Preliminary result)   Collection Time: 02/12/20  1:47 PM   Specimen: BLOOD  Result Value Ref Range Status   Specimen Description BLOOD BLOOD RIGHT FOREARM  Final   Special Requests   Final    BOTTLES DRAWN AEROBIC AND ANAEROBIC Blood Culture results may not be optimal due to an inadequate volume of blood received in culture bottles   Culture   Final    NO GROWTH 4 DAYS Performed at [facility], [address], [place], [place] [address]    Report Status PENDING  Incomplete  MRSA PCR Screening     Status: None   Collection Time: 12-Feb-2020  7:46 PM   Specimen: Nasopharyngeal  Result Value Ref Range Status   MRSA by PCR NEGATIVE NEGATIVE Final    Comment:        The GeneXpert MRSA Assay (FDA approved for NASAL specimens only), is one component of a comprehensive MRSA colonization surveillance program. It is not intended to diagnose MRSA infection nor to guide or monitor treatment for MRSA infections. Performed at [facility], [address], [place], [place] [address]   Culture, respiratory (non-expectorated)     Status: None   Collection Time: 02/03/20 12:15 AM   Specimen: Tracheal Aspirate; Respiratory  Result Value Ref Range Status   Specimen Description TRACHEAL ASPIRATE  Final   Special Requests NONE  Final   Gram Stain   Final    ABUNDANT WBC PRESENT, PREDOMINANTLY PMN ABUNDANT  YEAST Performed at [facility], [address], [place], [place] [address]    Culture   Final    MODERATE CANDIDA GLABRATA MODERATE CANDIDA DUBLINIENSIS    Report Status 02/07/2020 FINAL  Final    Lab Basic Metabolic Panel: Recent Labs  Lab 02/02/20 0108 02/02/20 0108 02/02/20 0455 02/02/20 0520 02/03/20 0403 02/03/20 0403 02/03/20 0407 02/03/20 0821 02/03/20 1511 02/04/20 0706 02/03/2020 0605  NA 139   < > 141   < > 141   < > 143 143 143 140 145  K 3.0*   < > 3.3*   < > 3.2*   < > 3.3* 3.7 3.8 4.1 4.3  CL 106  --  108  --  110  --   --   --   --  109 114*  CO2 19*  --  19*  --  19*  --   --   --   --  17* 17*  GLUCOSE 121*  --  116*  --  93  --   --   --   --  119* 115*  BUN 22*  --  22*  --  31*  --   --   --   --  48* 58*  CREATININE 2.10*  --  2.21*  --  2.74*  --   --   --   --  4.30* 4.95*  CALCIUM 8.3*  --  7.8*  --  8.3*  --   --   --   --  8.3* 8.4*  MG  --   --  1.4*  --  2.4  --   --   --   --  2.4 2.5*  PHOS  --   --  3.0  --  6.2*  --   --   --   --   --   --    < > = values in this interval not displayed.   Liver Function Tests: Recent Labs  Lab 2020/02/15 1150  AST 76*  ALT 20  ALKPHOS 95  BILITOT 1.2  PROT 8.0  ALBUMIN 3.7   No results for input(s): LIPASE, AMYLASE in the last 168 hours. No results for input(s): AMMONIA in the last 168 hours. CBC: Recent Labs  Lab 15-Feb-2020 1150 02-15-2020 2113 02/02/20 0455 02/02/20 0520 02/03/20 0403 02/03/20 0403 02/03/20 0407 02/03/20 0821 02/03/20 1511 02/04/20 0706 02/06/2020 0605  WBC 21.5*  --  23.4*  --  23.4*  --   --   --   --  18.6* 12.8*  NEUTROABS 10.4*  --   --   --   --   --   --   --   --  15.9* 11.3*  HGB 13.0   < > 11.5*   < > 10.3*   < > 10.2* 9.9* 9.2* 9.5* 8.8*  HCT 39.0   < > 33.3*   < > 30.5*   < > 30.0* 29.0* 27.0* 29.5* 27.8*  MCV 100.0  --  97.1  --  102.0*  --   --   --   --  104.2* 105.3*  PLT 382  --  281  --  266  --   --   --   --  271 197   < > = values in this  interval not displayed.   Cardiac Enzymes: No results for input(s): CKTOTAL, CKMB, CKMBINDEX, TROPONINI in the last 168 hours. Sepsis Labs: Recent Labs  Lab 02/02/20 0455 02/03/20 0403 02/04/20 0706 02/12/2020 0605  WBC 23.4* 23.4* 18.6* 12.8*    Procedures/Operations  Mechanical ventilation, continuous EEG   Adeja Sarratt 02/07/2020, 7:05 PM

## 2020-03-01 NOTE — Progress Notes (Signed)
Patient compassionately extubated per MD order at family's request. 

## 2020-03-01 NOTE — Progress Notes (Signed)
Wasted 49mL of dilaudid and of versed in stericycle with Cybill, RN.

## 2020-03-01 NOTE — Progress Notes (Signed)
LTM EEG discontinued - no skin breakdown at unhook.   

## 2020-03-01 NOTE — Progress Notes (Signed)
eLink Physician-Brief Progress Note Patient Name: Angelica Stevens DOB: [birth year] MRN: [id number]   Date of Service  02/24/2020  HPI/Events of Note  Patient needs a Flexiseal order for watery diarrhea, she also needs a.m. labs ordered.  eICU Interventions  Flexiseal and a.m. labs ordered.        Thomasene Lot Dencil Cayson 02/16/2020, 5:14 AM

## 2020-03-01 DEATH — deceased

## 2021-04-01 IMAGING — CT CT HEAD W/O CM
3 series · 15 of 46 positions shown, 18 images · non-contrast
Comparison: None

CLINICAL DATA: Mental status changes, unknown cause, became
unresponsive after 2 seizures

EXAM:
CT HEAD WITHOUT CONTRAST
TECHNIQUE: Contiguous axial images were obtained from the base of the skull
through the vertex without intravenous contrast. Sagittal and
coronal MPR images reconstructed from axial data set.

[Series 2: head wo · axial · 0.39mm/px · z∈[+136,+256]mm · 9 of 29 slices shown, 12 images]
[im 3/29  brain]
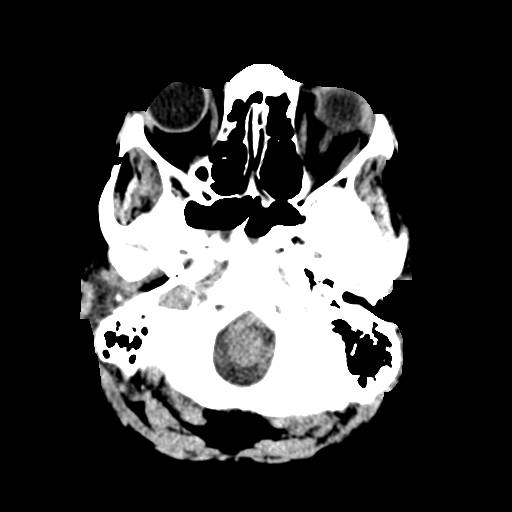
[im 3/29  bone]
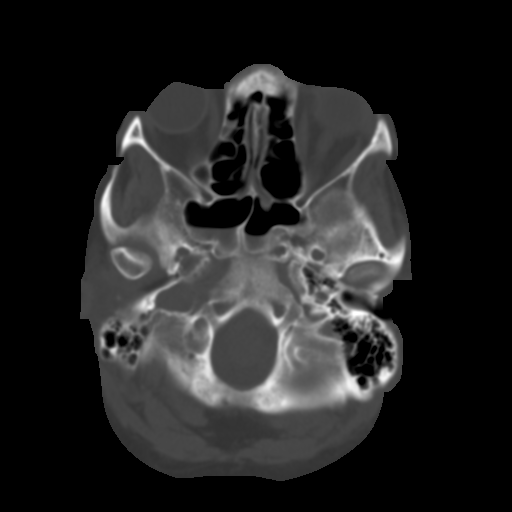
[im 6/29  brain]
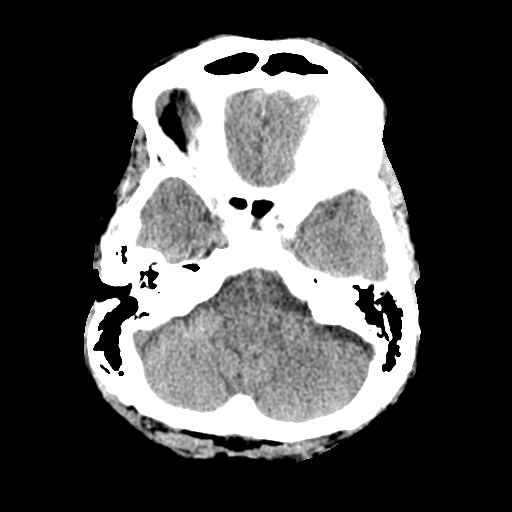
[im 9/29  brain]
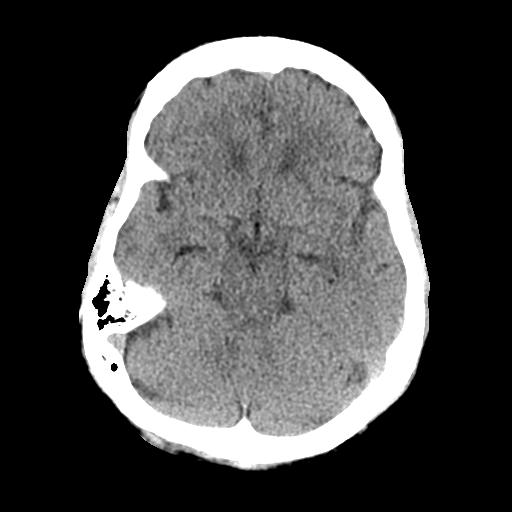
[im 12/29  brain]
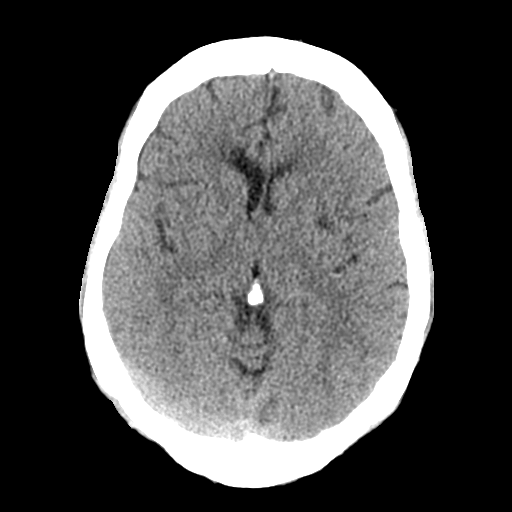
[im 15/29  brain]
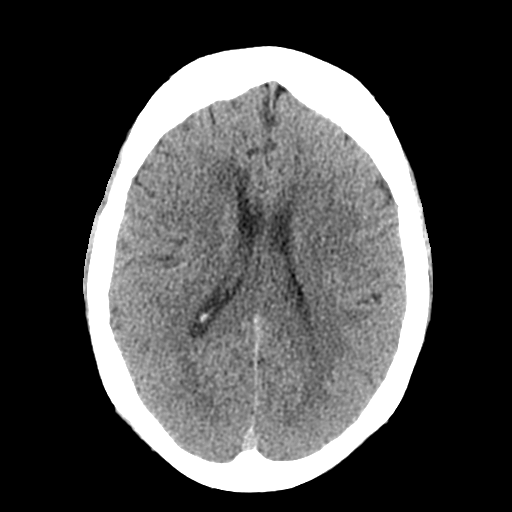
[im 15/29  bone]
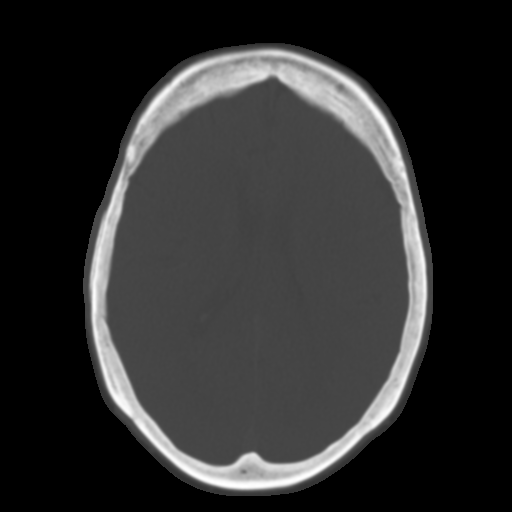
[im 18/29  brain]
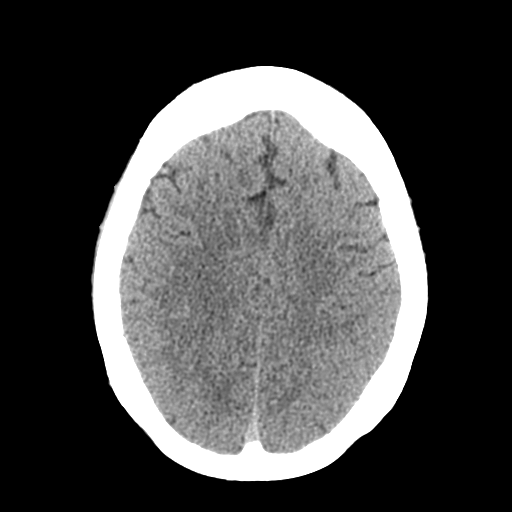
[im 21/29  brain]
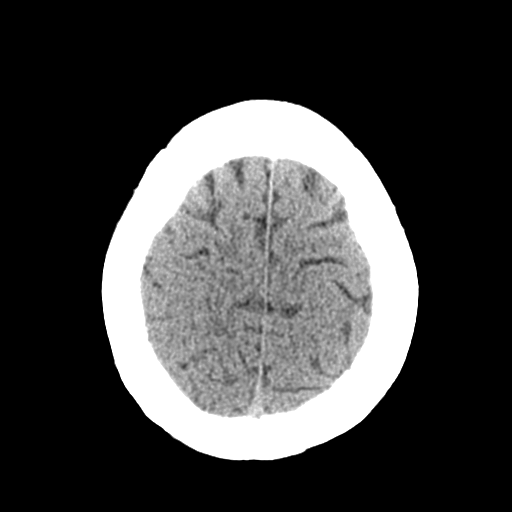
[im 24/29  brain]
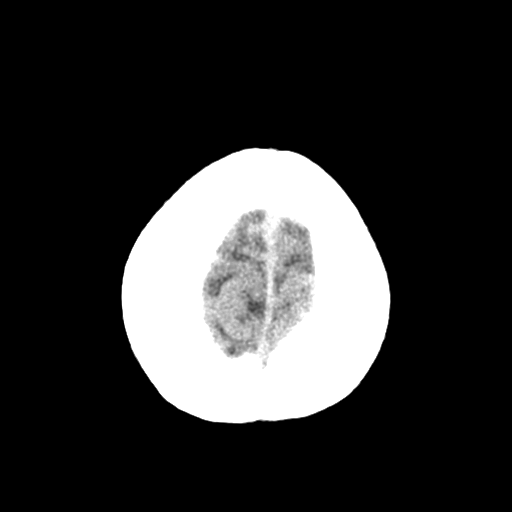
[im 27/29  brain]
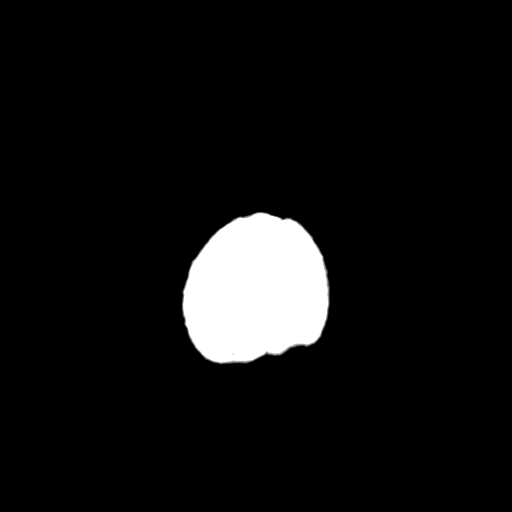
[im 27/29  bone]
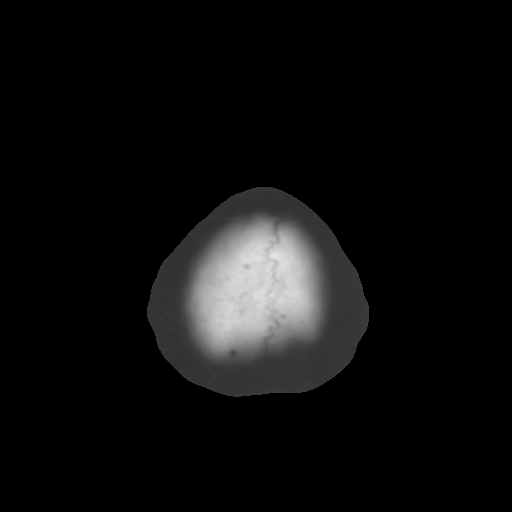

[Series 4: coronal soft tissue · coronal · 0.30mm/px · 3 of 64 slices shown]
[im 22/64  brain]
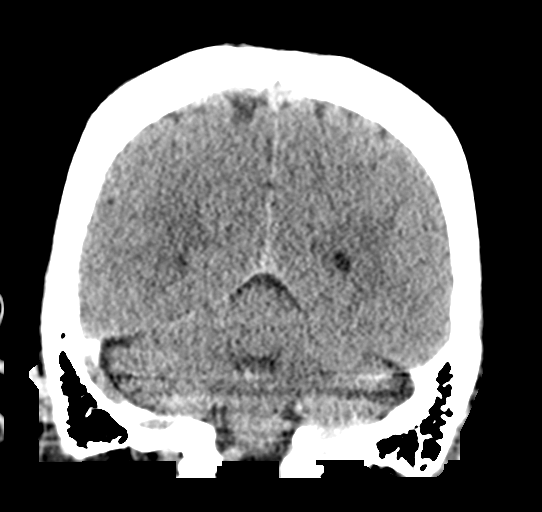
[im 29/64  brain]
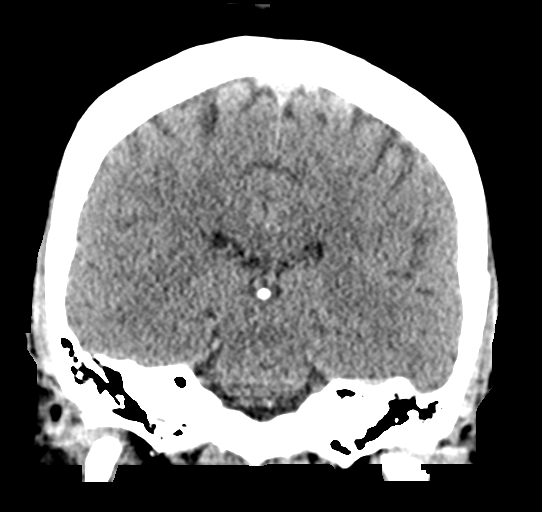
[im 36/64  brain]
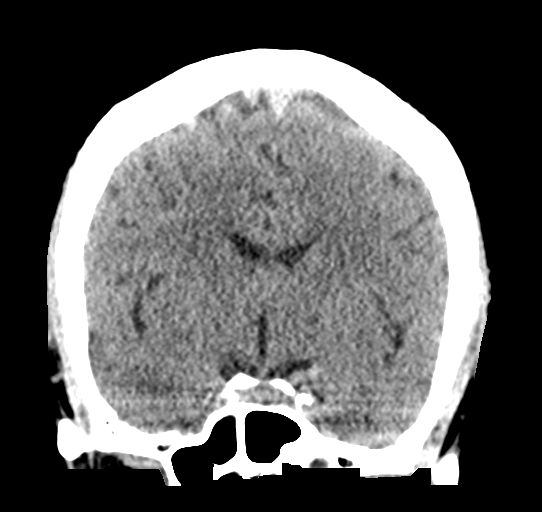

[Series 5: sagittal soft tissue · sagittal · 0.29mm/px · 3 of 56 slices shown]
[im 19/56  brain]
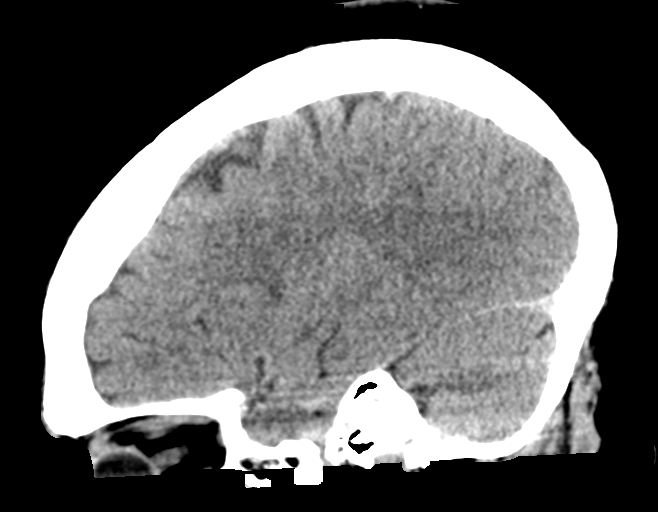
[im 28/56  brain]
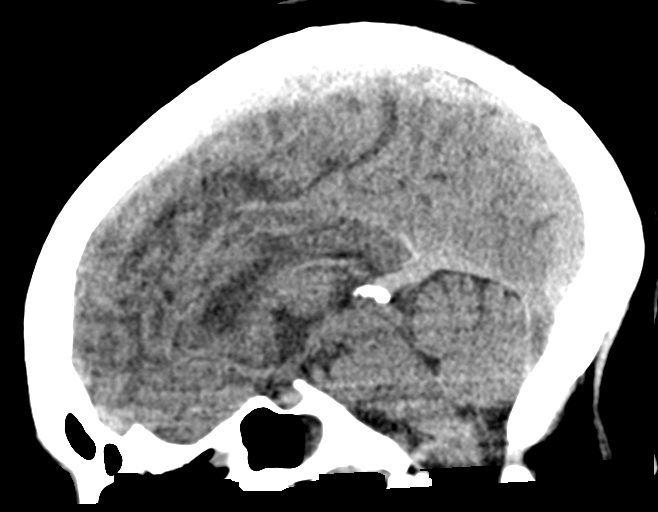
[im 37/56  brain]
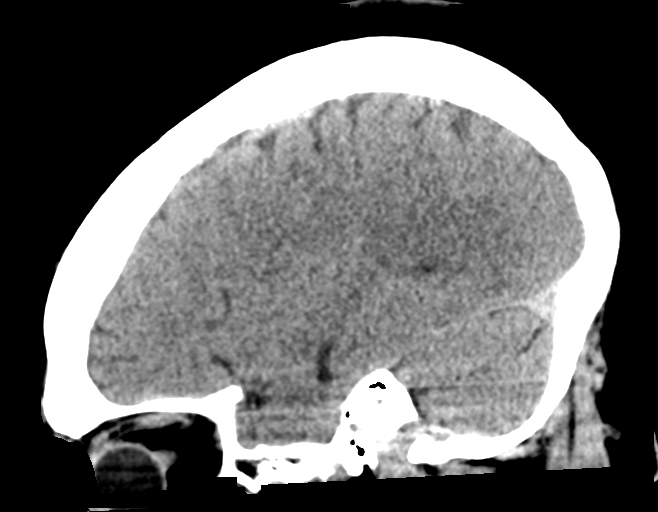

[15 of 46 positions shown; findings below may reference images not displayed]

FINDINGS: Brain: Slight asymmetry of the lateral ventricles RIGHT slightly
larger than LEFT. No hydrocephalus, midline shift or mass effect.
Minimal hypoattenuation of the periventricular white matter.
Low-attenuation at LEFT basal ganglia question age-indeterminate
lacunar infarct. No intracranial hemorrhage, mass lesion or
additional infarcts identified. No extra-axial fluid collections.

Vascular: No hyperdense vessels. Minimal atherosclerotic
calcification of internal carotid arteries bilaterally at skull base

Skull: Intact

Sinuses/Orbits: Small air-fluid levels in the sphenoid sinus.
Remaining paranasal sinuses and mastoid air cells clear

Other: N/A
IMPRESSION: Minimal small vessel ischemic changes of the periventricular white
matter.

Low-attenuation at LEFT basal ganglia suspect age-indeterminate
lacunar infarct.

No other intracranial abnormalities.

Air-fluid levels in the sphenoid sinus.

## 2021-04-01 IMAGING — DX DG CHEST 1V PORT
1 series · 1 of 1 positions shown · non-contrast
Comparison: No prior.

CLINICAL DATA: Post arrest.  Intubation

EXAM:
PORTABLE CHEST 1 VIEW

[chest ap]
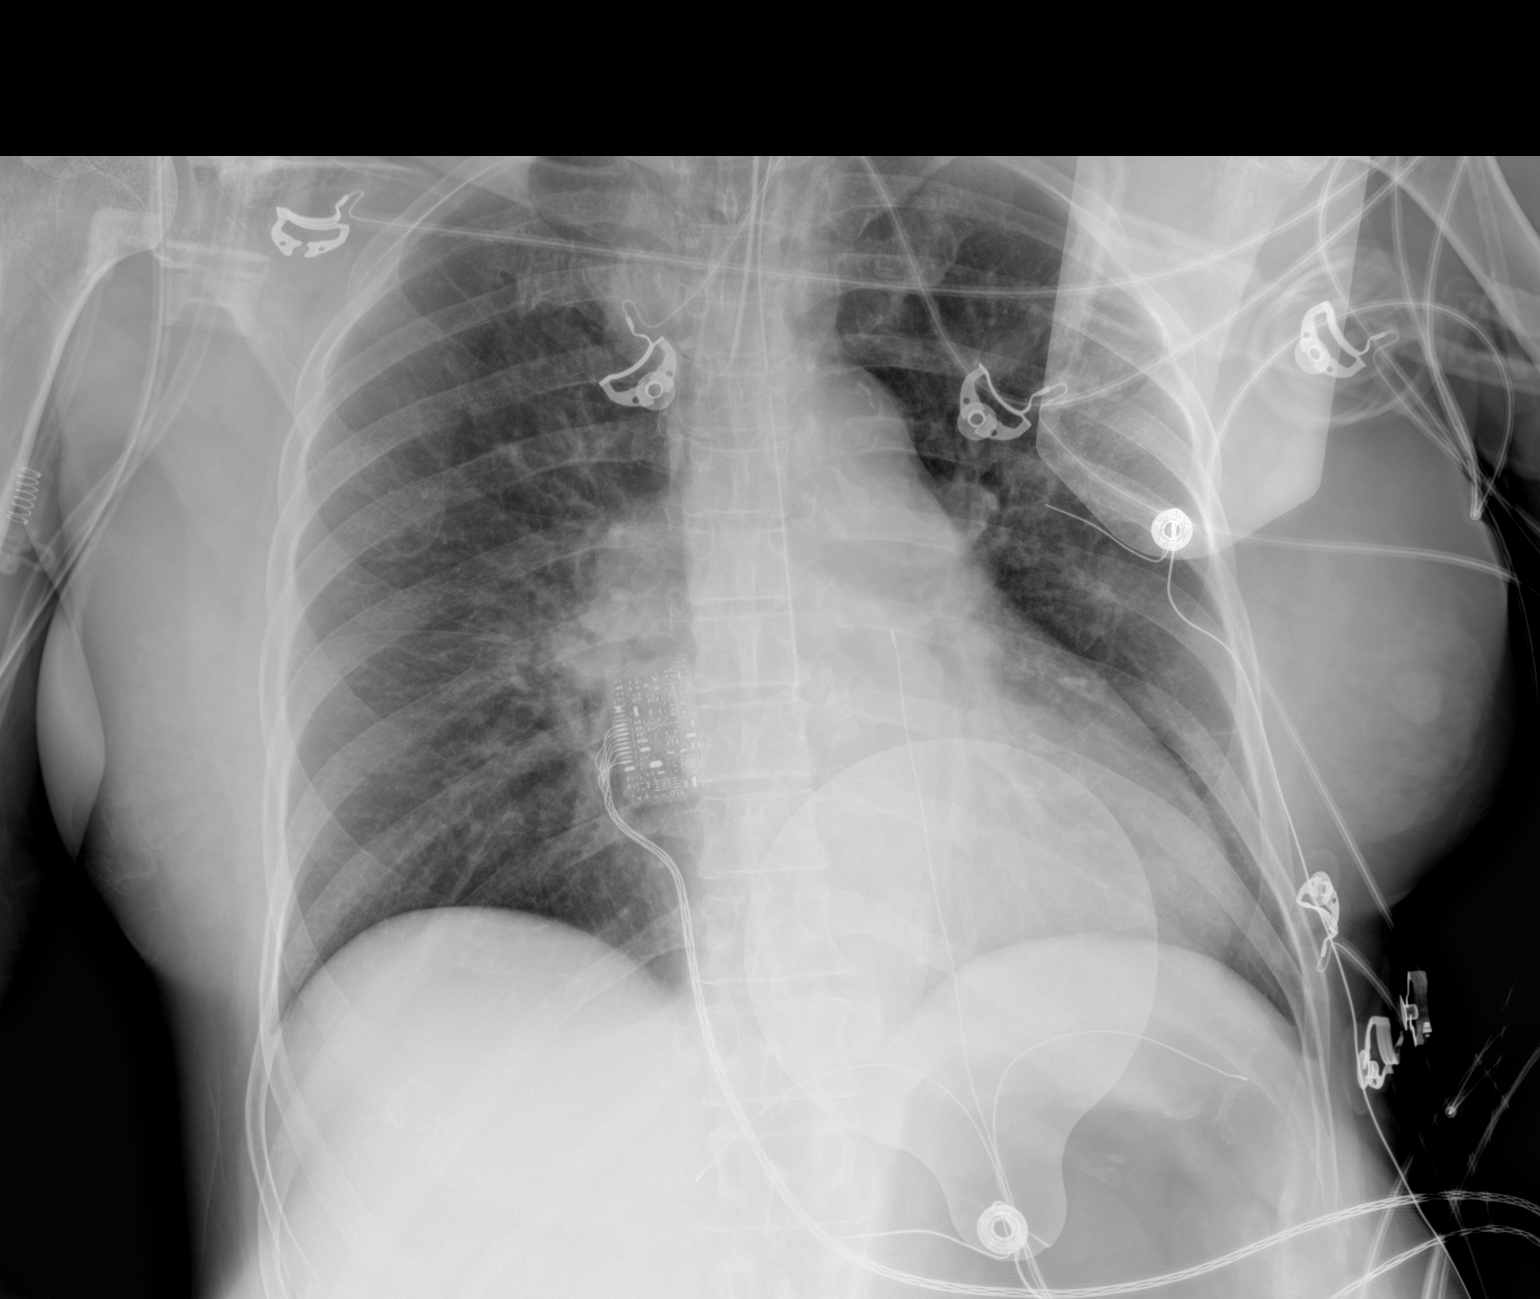

[1 of 1 positions shown; findings below may reference images not displayed]

FINDINGS: Endotracheal tube tip noted 3 cm above the carina. NG tube noted
with tip over the mid esophagus. Advancement of 20 cm should be
considered. Cardiomegaly. Mild bilateral interstitial prominence.
Mild CHF cannot be excluded. No pleural effusion or pneumothorax.
IMPRESSION: 1. Endotracheal tube tip noted 3 cm above the carina.
2. NG tube noted with tip over the mid esophagus. Advancement of 20
cm should be considered.
3. Cardiomegaly. Mild bilateral interstitial prominence. Mild CHF
cannot be excluded. No pneumothorax.
